# Patient Record
Sex: Male | Born: 1950 | Race: White | Hispanic: No | Marital: Married | Smoking: Former smoker
Health system: Southern US, Community
[De-identification: ages and names within clinical notes are randomized; demographics above are authoritative.]

## PROBLEM LIST (undated history)

## (undated) DIAGNOSIS — F32A Depression, unspecified: Secondary | ICD-10-CM

## (undated) DIAGNOSIS — M1990 Unspecified osteoarthritis, unspecified site: Secondary | ICD-10-CM

## (undated) DIAGNOSIS — I82409 Acute embolism and thrombosis of unspecified deep veins of unspecified lower extremity: Secondary | ICD-10-CM

## (undated) DIAGNOSIS — R972 Elevated prostate specific antigen [PSA]: Secondary | ICD-10-CM

## (undated) DIAGNOSIS — F329 Major depressive disorder, single episode, unspecified: Secondary | ICD-10-CM

## (undated) DIAGNOSIS — K4090 Unilateral inguinal hernia, without obstruction or gangrene, not specified as recurrent: Secondary | ICD-10-CM

## (undated) DIAGNOSIS — G2581 Restless legs syndrome: Secondary | ICD-10-CM

## (undated) HISTORY — DX: Acute embolism and thrombosis of unspecified deep veins of unspecified lower extremity: I82.409

## (undated) HISTORY — PX: WISDOM TOOTH EXTRACTION: SHX21

## (undated) HISTORY — PX: THROMBECTOMY: PRO61

## (undated) HISTORY — PX: TRIGGER FINGER RELEASE: SHX641

## (undated) HISTORY — DX: Elevated prostate specific antigen (PSA): R97.20

## (undated) HISTORY — PX: HERNIA REPAIR: SHX51

---

## 2015-05-08 ENCOUNTER — Encounter: Payer: Self-pay | Admitting: Gastroenterology

## 2015-05-20 ENCOUNTER — Encounter (HOSPITAL_COMMUNITY): Payer: Self-pay | Admitting: Emergency Medicine

## 2015-05-20 ENCOUNTER — Emergency Department (HOSPITAL_COMMUNITY)
Admission: EM | Admit: 2015-05-20 | Discharge: 2015-05-20 | Disposition: A | Discharge status: Home / Self Care | Payer: BLUE CROSS/BLUE SHIELD | Attending: Emergency Medicine | Admitting: Emergency Medicine

## 2015-05-20 DIAGNOSIS — Z8719 Personal history of other diseases of the digestive system: Secondary | ICD-10-CM | POA: Insufficient documentation

## 2015-05-20 DIAGNOSIS — S79922A Unspecified injury of left thigh, initial encounter: Secondary | ICD-10-CM | POA: Diagnosis present

## 2015-05-20 DIAGNOSIS — Y9289 Other specified places as the place of occurrence of the external cause: Secondary | ICD-10-CM | POA: Diagnosis not present

## 2015-05-20 DIAGNOSIS — X58XXXA Exposure to other specified factors, initial encounter: Secondary | ICD-10-CM | POA: Insufficient documentation

## 2015-05-20 DIAGNOSIS — Z8669 Personal history of other diseases of the nervous system and sense organs: Secondary | ICD-10-CM | POA: Insufficient documentation

## 2015-05-20 DIAGNOSIS — S76212A Strain of adductor muscle, fascia and tendon of left thigh, initial encounter: Secondary | ICD-10-CM | POA: Diagnosis not present

## 2015-05-20 DIAGNOSIS — Z8659 Personal history of other mental and behavioral disorders: Secondary | ICD-10-CM | POA: Diagnosis not present

## 2015-05-20 DIAGNOSIS — S39011A Strain of muscle, fascia and tendon of abdomen, initial encounter: Secondary | ICD-10-CM

## 2015-05-20 DIAGNOSIS — Y9389 Activity, other specified: Secondary | ICD-10-CM | POA: Insufficient documentation

## 2015-05-20 DIAGNOSIS — Z8739 Personal history of other diseases of the musculoskeletal system and connective tissue: Secondary | ICD-10-CM | POA: Insufficient documentation

## 2015-05-20 DIAGNOSIS — Y998 Other external cause status: Secondary | ICD-10-CM | POA: Insufficient documentation

## 2015-05-20 HISTORY — DX: Unspecified osteoarthritis, unspecified site: M19.90

## 2015-05-20 HISTORY — DX: Unilateral inguinal hernia, without obstruction or gangrene, not specified as recurrent: K40.90

## 2015-05-20 HISTORY — DX: Restless legs syndrome: G25.81

## 2015-05-20 HISTORY — DX: Major depressive disorder, single episode, unspecified: F32.9

## 2015-05-20 HISTORY — DX: Depression, unspecified: F32.A

## 2015-05-20 NOTE — ED Notes (Addendum)
Pt c/o left groin pain onset last night after squatting down. Pt reports when he went to stand up he felt a tear and heard a pop sound. Pt reports that entire leg is swollen and bruised. Pt had a hernia repair 12 years on same side with mesh.

## 2015-05-25 ENCOUNTER — Ambulatory Visit (HOSPITAL_COMMUNITY)
Admission: RE | Admit: 2015-05-25 | Discharge: 2015-05-25 | Disposition: A | Discharge status: Home / Self Care | Payer: BLUE CROSS/BLUE SHIELD | Source: Ambulatory Visit | Attending: Cardiovascular Disease | Admitting: Cardiovascular Disease

## 2015-05-25 ENCOUNTER — Other Ambulatory Visit (HOSPITAL_COMMUNITY): Payer: Self-pay | Admitting: Internal Medicine

## 2015-05-25 DIAGNOSIS — I82402 Acute embolism and thrombosis of unspecified deep veins of left lower extremity: Secondary | ICD-10-CM | POA: Insufficient documentation

## 2015-05-25 DIAGNOSIS — R52 Pain, unspecified: Secondary | ICD-10-CM

## 2015-05-25 DIAGNOSIS — I82812 Embolism and thrombosis of superficial veins of left lower extremities: Secondary | ICD-10-CM | POA: Diagnosis not present

## 2015-05-25 DIAGNOSIS — M79605 Pain in left leg: Secondary | ICD-10-CM | POA: Diagnosis not present

## 2015-05-25 DIAGNOSIS — M7989 Other specified soft tissue disorders: Secondary | ICD-10-CM | POA: Diagnosis not present

## 2015-05-28 ENCOUNTER — Other Ambulatory Visit: Payer: Self-pay | Admitting: Internal Medicine

## 2015-05-28 DIAGNOSIS — M79605 Pain in left leg: Secondary | ICD-10-CM

## 2015-05-30 ENCOUNTER — Ambulatory Visit
Admission: RE | Admit: 2015-05-30 | Discharge: 2015-05-30 | Disposition: A | Discharge status: Home / Self Care | Payer: BLUE CROSS/BLUE SHIELD | Source: Ambulatory Visit | Attending: Internal Medicine | Admitting: Internal Medicine

## 2015-05-30 DIAGNOSIS — M79605 Pain in left leg: Secondary | ICD-10-CM

## 2015-05-30 NOTE — ED Provider Notes (Signed)
CSN: [id number]     Arrival date & time 05/20/15  1255 History   First MD Initiated Contact with Patient 05/20/15 1420     Chief Complaint  Patient presents with  . Groin Pain  . Leg Pain     (Consider location/radiation/quality/duration/timing/severity/associated sxs/prior Treatment) HPI   65 year old male with left groin pain. Acute onset last night. Patient was standing up from a squatting position. S he was doing this he felt a "pop" in his left inguinal region. He has had persistent soreness since then which is worse with certain movements. He is also noticed diffuse swelling of his left lower extremity. No numbness or tingling. Reports past history of left inguinal hernia repair but no significant issues since that time. No urinary complaints. No rash. No mass.  Past Medical History  Diagnosis Date  . Hernia, inguinal   . Arthritis   . Restless leg syndrome   . Depression    Past Surgical History  Procedure Laterality Date  . Hernia repair    . Trigger finger release    . Wisdom tooth extraction     No family history on file. Social History  Substance Use Topics  . Smoking status: Never Smoker   . Smokeless tobacco: None  . Alcohol Use: Yes     Comment: 1 beer a night    Review of Systems  All systems reviewed and negative, other than as noted in HPI.   Allergies  Review of patient's allergies indicates no known allergies.  Home Medications   Prior to Admission medications   Not on File   BP 144/79 mmHg  Pulse 79  Temp(Src) 98.2 F (36.8 C) (Oral)  Resp 18  Ht 6' 0.5" (1.842 m)  SpO2 97% Physical Exam  Constitutional: He appears well-developed and well-nourished. No distress.  HENT:  Head: Normocephalic and atraumatic.  Eyes: Conjunctivae are normal. Right eye exhibits no discharge. Left eye exhibits no discharge.  Neck: Neck supple.  Cardiovascular: Normal rate, regular rhythm and normal heart sounds.  Exam reveals no gallop and no friction rub.    No murmur heard. Pulmonary/Chest: Effort normal and breath sounds normal. No respiratory distress.  Abdominal: Soft. He exhibits no distension. There is no tenderness.  Musculoskeletal: He exhibits no edema or tenderness.  Diffuse swelling of left lower extremity from inguinal region - distally. Some mild tenderness to palpation along the left inguinal crease. No overlying skin changes. Noble/mass. Patient reports increased pain with leg adduction. Neurovascular intact distally.  Neurological: He is alert.  Skin: Skin is warm and dry.  Psychiatric: He has a normal mood and affect. His behavior is normal. Thought content normal.  Nursing note and vitals reviewed.   ED Course  Procedures (including critical care time) Labs Review Labs Reviewed - No data to display  Imaging Review Ct Femur Left Wo Contrast  05/30/2015  CLINICAL DATA:  Left groin and thigh pain. History of blood clot (DVT?) on 05/25/2015. Pain for 10 days. Assessment for fracture. Prior hernia repair with mesh. EXAM: CT OF THE LEFT FEMUR WITHOUT CONTRAST TECHNIQUE: Multidetector CT imaging was performed according to the standard protocol. Multiplanar CT image reconstructions were also generated. COMPARISON:  None. FINDINGS: Low-level presacral edema in the pelvis. There stranding around the external iliac artery and vein, common iliac artery and vein, and superficial femoral artery and vein, with the vein demonstrating higher luminal density than the artery throughout this entire course. This is most confluent overlying the external iliac vessels as on  image 1 series 5. Some of the stranding along the left groin may be from the reported prior hernia repair. I do not see a significant recurrent hernia. An injury along the hernia repair could also cause stranding along the inguinal region shown on track today's exam but is considered unlikely. Subcutaneous edema noted in the thigh and lateral to the left hip. Trace knee effusion. There  is degenerative spurring of the acetabulum and femoral head. Synovial herniation pit at the junction of the left femoral head and neck. Chronically fragmented acetabular spur anteriorly. No femur fracture identified. No CT findings of iliopsoas avulsion. No compelling findings of hamstring avulsion. No hip effusion. IMPRESSION: 1. Abnormal difference in density between the left external iliac, common femoral, and superficial femoral and popliteal artery and vein, possible causes diffuse left lower extremity DVT versus less likely occlusion of the left femoral artery. Normally on a noncontrast exam these 2 vessels would be similar in density. There is abnormal stranding around the vascular structures which would seem to favor DVT. Correlate with any recent sonography. 2. Hematoma or confluent edema superficial to the external iliac vessels on image 1 series 5. There is some stranding along the left inguinal ring which could be postoperative or less likely due to an injury at prior operative site in this vicinity. However this stranding is not really out of proportion to stranding elsewhere in the lower pelvis. 3. Subcutaneous edema in the thigh and lateral to the left hip, nonspecific. 4. No fracture or acute bony findings. Degenerative arthropathy of the left hip. 5. Trace knee effusion. Electronically Signed   By: Gaylyn Rong M.D.   On: 05/30/2015 08:55   I have personally reviewed and evaluated these images and lab results as part of my medical decision-making.   EKG Interpretation None      MDM   Final diagnoses:  Strain of groin, initial encounter    65 year old male with symptoms and mechanism consistent with left groin strain. Family is concerned for possible DVT. Clinically I doubt this although patient does have diffuse swelling of his left lower extremity which is not necessarily consistent with a simple left groin strain. He has no calf tenderness. His neurovascular intact. Is a past  history of left internal hernia repair. Cannot appreciate any 7 bulge in this area. There is no bruising or other concerning skin changes. At this point plan symptomatic treatment. Return precautions were discussed. Advise follow-up if symptoms not begin to improve the next few days. Emergent return precautions sooner were discussed.    Raeford Razor, MD 05/30/15 1500

## 2015-05-31 ENCOUNTER — Encounter: Payer: Self-pay | Admitting: Vascular Surgery

## 2015-05-31 ENCOUNTER — Inpatient Hospital Stay (HOSPITAL_COMMUNITY)
Admission: AD | Admit: 2015-05-31 | Discharge: 2015-06-04 | DRG: 272 | Disposition: A | Discharge status: Home / Self Care | Payer: BLUE CROSS/BLUE SHIELD | Source: Ambulatory Visit | Attending: Vascular Surgery | Admitting: Vascular Surgery

## 2015-05-31 ENCOUNTER — Ambulatory Visit (INDEPENDENT_AMBULATORY_CARE_PROVIDER_SITE_OTHER): Payer: BLUE CROSS/BLUE SHIELD | Admitting: Vascular Surgery

## 2015-05-31 ENCOUNTER — Other Ambulatory Visit: Payer: Self-pay | Admitting: Physician Assistant

## 2015-05-31 VITALS — BP 153/86 | HR 87 | Temp 99.2°F | Ht 72.5 in | Wt 200.0 lb

## 2015-05-31 DIAGNOSIS — Z79899 Other long term (current) drug therapy: Secondary | ICD-10-CM

## 2015-05-31 DIAGNOSIS — I82402 Acute embolism and thrombosis of unspecified deep veins of left lower extremity: Secondary | ICD-10-CM

## 2015-05-31 DIAGNOSIS — F329 Major depressive disorder, single episode, unspecified: Secondary | ICD-10-CM | POA: Diagnosis present

## 2015-05-31 DIAGNOSIS — I8290 Acute embolism and thrombosis of unspecified vein: Secondary | ICD-10-CM

## 2015-05-31 DIAGNOSIS — I82412 Acute embolism and thrombosis of left femoral vein: Secondary | ICD-10-CM | POA: Diagnosis present

## 2015-05-31 DIAGNOSIS — G2581 Restless legs syndrome: Secondary | ICD-10-CM | POA: Diagnosis present

## 2015-05-31 DIAGNOSIS — Z7901 Long term (current) use of anticoagulants: Secondary | ICD-10-CM

## 2015-05-31 DIAGNOSIS — I82422 Acute embolism and thrombosis of left iliac vein: Secondary | ICD-10-CM | POA: Diagnosis present

## 2015-05-31 DIAGNOSIS — I82432 Acute embolism and thrombosis of left popliteal vein: Secondary | ICD-10-CM | POA: Diagnosis present

## 2015-05-31 DIAGNOSIS — Z8249 Family history of ischemic heart disease and other diseases of the circulatory system: Secondary | ICD-10-CM

## 2015-05-31 DIAGNOSIS — I82409 Acute embolism and thrombosis of unspecified deep veins of unspecified lower extremity: Secondary | ICD-10-CM | POA: Insufficient documentation

## 2015-05-31 LAB — COMPREHENSIVE METABOLIC PANEL
ALT: 113 U/L — AB (ref 17–63)
AST: 103 U/L — AB (ref 15–41)
Albumin: 2.5 g/dL — ABNORMAL LOW (ref 3.5–5.0)
Alkaline Phosphatase: 245 U/L — ABNORMAL HIGH (ref 38–126)
Anion gap: 11 (ref 5–15)
BUN: 15 mg/dL (ref 6–20)
CHLORIDE: 98 mmol/L — AB (ref 101–111)
CO2: 28 mmol/L (ref 22–32)
CREATININE: 0.86 mg/dL (ref 0.61–1.24)
Calcium: 9 mg/dL (ref 8.9–10.3)
GFR calc Af Amer: >60 mL/min (ref >60)
Glucose, Bld: 116 mg/dL — ABNORMAL HIGH (ref 65–99)
Potassium: 3.9 mmol/L (ref 3.5–5.1)
Sodium: 137 mmol/L (ref 135–145)
Total Bilirubin: 0.1 mg/dL — ABNORMAL LOW (ref 0.3–1.2)
Total Protein: 6.9 g/dL (ref 6.5–8.1)

## 2015-05-31 LAB — APTT: APTT: 38 s — AB (ref 24–37)

## 2015-05-31 LAB — CBC
HEMATOCRIT: 32.9 % — AB (ref 39.0–52.0)
Hemoglobin: 11 g/dL — ABNORMAL LOW (ref 13.0–17.0)
MCH: 28.2 pg (ref 26.0–34.0)
MCHC: 33.4 g/dL (ref 30.0–36.0)
MCV: 84.4 fL (ref 78.0–100.0)
PLATELETS: 372 10*3/uL (ref 150–400)
RBC: 3.9 MIL/uL — AB (ref 4.22–5.81)
RDW: 13 % (ref 11.5–15.5)
WBC: 9.2 10*3/uL (ref 4.0–10.5)

## 2015-05-31 LAB — URINALYSIS, ROUTINE W REFLEX MICROSCOPIC
Bilirubin Urine: NEGATIVE
GLUCOSE, UA: NEGATIVE mg/dL
Hgb urine dipstick: NEGATIVE
KETONES UR: NEGATIVE mg/dL
LEUKOCYTES UA: NEGATIVE
Nitrite: NEGATIVE
PH: 6 (ref 5.0–8.0)
Protein, ur: NEGATIVE mg/dL
SPECIFIC GRAVITY, URINE: 1.011 (ref 1.005–1.030)

## 2015-05-31 LAB — PROTIME-INR
INR: 1.28 (ref 0.00–1.49)
PROTHROMBIN TIME: 16.1 s — AB (ref 11.6–15.2)

## 2015-05-31 LAB — HEPARIN LEVEL (UNFRACTIONATED): HEPARIN UNFRACTIONATED: 0.47 [IU]/mL (ref 0.30–0.70)

## 2015-05-31 MED ORDER — MORPHINE SULFATE (PF) 2 MG/ML IV SOLN
2.0000 mg | INTRAVENOUS | Status: DC | PRN
  Administered 2015-06-01 (×4): 2 mg via INTRAVENOUS
  Administered 2015-06-01: 1 mg via INTRAVENOUS
  Administered 2015-06-01: 2 mg via INTRAVENOUS
  Administered 2015-06-01 (×2): 3 mg via INTRAVENOUS
  Administered 2015-06-01: 2 mg via INTRAVENOUS
  Administered 2015-06-02: 3 mg via INTRAVENOUS
  Administered 2015-06-02: 2 mg via INTRAVENOUS
  Administered 2015-06-02 – 2015-06-03 (×3): 3 mg via INTRAVENOUS
  Filled 2015-05-31 (×2): qty 2
  Filled 2015-05-31 (×2): qty 1
  Filled 2015-05-31: qty 2
  Filled 2015-05-31 (×3): qty 1
  Filled 2015-05-31: qty 2
  Filled 2015-05-31: qty 1
  Filled 2015-05-31 (×2): qty 2
  Filled 2015-05-31: qty 1

## 2015-05-31 MED ORDER — LABETALOL HCL 5 MG/ML IV SOLN: 10.0000 mg | INTRAVENOUS | Status: DC | PRN

## 2015-05-31 MED ORDER — GUAIFENESIN-DM 100-10 MG/5ML PO SYRP
15.0000 mL | ORAL_SOLUTION | ORAL | Status: DC | PRN
  Filled 2015-05-31: qty 15

## 2015-05-31 MED ORDER — BISACODYL 10 MG RE SUPP: 10.0000 mg | Freq: Every day | RECTAL | Status: DC | PRN

## 2015-05-31 MED ORDER — ROPINIROLE HCL 1 MG PO TABS
1.0000 mg | ORAL_TABLET | Freq: Every day | ORAL | Status: DC
  Filled 2015-05-31 (×4): qty 1

## 2015-05-31 MED ORDER — HEPARIN (PORCINE) IN NACL 100-0.45 UNIT/ML-% IJ SOLN
1500.0000 [IU]/h | INTRAMUSCULAR | Status: DC
  Administered 2015-06-01: 1500 [IU]/h via INTRAVENOUS
  Filled 2015-05-31 (×2): qty 250

## 2015-05-31 MED ORDER — OXYCODONE HCL 5 MG PO TABS
5.0000 mg | ORAL_TABLET | ORAL | Status: DC | PRN
  Administered 2015-05-31 (×2): 10 mg via ORAL
  Administered 2015-06-01 (×2): 5 mg via ORAL
  Administered 2015-06-01: 10 mg via ORAL
  Administered 2015-06-01 – 2015-06-03 (×6): 5 mg via ORAL
  Filled 2015-05-31 (×2): qty 1
  Filled 2015-05-31 (×2): qty 2
  Filled 2015-05-31 (×5): qty 1
  Filled 2015-05-31: qty 2

## 2015-05-31 MED ORDER — ACETAMINOPHEN 325 MG PO TABS
325.0000 mg | ORAL_TABLET | ORAL | Status: DC | PRN
  Administered 2015-05-31 – 2015-06-01 (×2): 650 mg via ORAL
  Filled 2015-05-31 (×2): qty 1
  Filled 2015-05-31: qty 2

## 2015-05-31 MED ORDER — ACETAMINOPHEN 325 MG RE SUPP
325.0000 mg | RECTAL | Status: DC | PRN
  Filled 2015-05-31: qty 2

## 2015-05-31 MED ORDER — ONDANSETRON HCL 4 MG/2ML IJ SOLN: 4.0000 mg | Freq: Four times a day (QID) | INTRAMUSCULAR | Status: DC | PRN

## 2015-05-31 MED ORDER — PANTOPRAZOLE SODIUM 40 MG PO TBEC
40.0000 mg | DELAYED_RELEASE_TABLET | Freq: Every day | ORAL | Status: DC
  Administered 2015-05-31 – 2015-06-03 (×3): 40 mg via ORAL
  Filled 2015-05-31 (×3): qty 1

## 2015-05-31 MED ORDER — PHENOL 1.4 % MT LIQD: 1.0000 | OROMUCOSAL | Status: DC | PRN

## 2015-05-31 MED ORDER — PAROXETINE HCL 20 MG PO TABS
20.0000 mg | ORAL_TABLET | Freq: Every day | ORAL | Status: DC
  Filled 2015-05-31: qty 1

## 2015-05-31 MED ORDER — MAGNESIUM HYDROXIDE 400 MG/5ML PO SUSP: 30.0000 mL | Freq: Every day | ORAL | Status: DC | PRN

## 2015-05-31 MED ORDER — HEPARIN BOLUS VIA INFUSION
5000.0000 [IU] | Freq: Once | INTRAVENOUS | Status: DC
  Filled 2015-05-31: qty 5000

## 2015-05-31 MED ORDER — ALUM & MAG HYDROXIDE-SIMETH 200-200-20 MG/5ML PO SUSP
15.0000 mL | ORAL | Status: DC | PRN
  Filled 2015-05-31: qty 30

## 2015-05-31 MED ORDER — ROPINIROLE HCL 1 MG PO TABS
0.5000 mg | ORAL_TABLET | Freq: Every day | ORAL | Status: DC
  Filled 2015-05-31: qty 1

## 2015-05-31 MED ORDER — METOPROLOL TARTRATE 1 MG/ML IV SOLN: 2.0000 mg | INTRAVENOUS | Status: DC | PRN

## 2015-05-31 MED ORDER — HYDRALAZINE HCL 20 MG/ML IJ SOLN
5.0000 mg | INTRAMUSCULAR | Status: DC | PRN
Start: 2015-05-31 — End: 2015-06-03

## 2015-05-31 NOTE — Progress Notes (Signed)
HISTORY AND PHYSICAL     CC:  Acute DVT Referring Provider:  Alysia Penna, MD  HPI: This is a 65 y.o. Rice who states that last Saturday (11 days ago), he was in his garage doing some work bending down.  When he stood up, he felt a pop in his left groin.  He states that he had swelling in the left thigh.  He went to bed with his leg elevated and when he woke, his entire leg was swollen down to the ankle.  He went to the ER that day and was told he had a groin strain.  His wife states they did not do any tests and told them they did not think it was a DVT.  He continued to have swelling and had a venous ultrasound on 05/25/15, which revealed acute DVT throughout the left leg with superficial thrombus in the left proximal greater saphenous vein.   His pain is not well controlled on the hydrocodone, which he was taking every 8 hours and has been increased to every 6 hours.  The pt was placed on Xarelto last Friday, 05/25/15.  Dr. Link Snuffer referred the pt to Dr. Darrick Penna for further evaluation.  The pt states he last traveled to the [place] in August 2016.  He does have family hx of DVT with his brother who is on Warfarin for this.    He has a hx of left hernia repair with mesh ~ 12 years ago and trigger finger release.  He has hx of arthritis, restless leg syndrome and some mild depression.  He does not have any hx of bleeding (GIB or brain).  Past Medical History  Diagnosis Date  . Hernia, inguinal   . Arthritis   . Restless leg syndrome   . Depression     Past Surgical History  Procedure Laterality Date  . Hernia repair    . Trigger finger release    . Wisdom tooth extraction      No Known Allergies  No current outpatient prescriptions on file.   No current facility-administered medications for this visit.   Family History  Problem Relation Age of Onset  . Heart disease Mother   . Arthritis Mother   . Hypertension Father   . Stroke Father       Social History   Social  History  . Marital Status: Married    Spouse Name: N/A  . Number of Children: N/A  . Years of Education: N/A   Occupational History  . Not on file.   Social History Main Topics  . Smoking status: Never Smoker   . Smokeless tobacco: Not on file  . Alcohol Use: Yes     Comment: 1 beer a night  . Drug Use: No  . Sexual Activity: Not on file   Other Topics Concern  . Not on file   Social History Narrative  . No narrative on file     REVIEW OF SYSTEMS:    denotes positive finding,  denotes negative finding Cardiac  Comments:  Chest pain or chest pressure:    Shortness of breath upon exertion:    Short of breath when lying flat:    Irregular heart rhythm:        Vascular    Pain in calf, thigh, or hip brought on by ambulation:    Pain in left leg  x Developed sudden onset of left leg pain 05/19/15  Blood clot in your veins:    Leg swelling:  x Left leg      Pulmonary    Oxygen at home:    Productive cough:     Wheezing:         Neurologic    Sudden weakness in arms or legs:     Sudden numbness in arms or legs:     Sudden onset of difficulty speaking or slurred speech:    Temporary loss of vision in one eye:     Problems with dizziness:         Gastrointestinal    Blood in stool:     Vomited blood:         Genitourinary    Burning when urinating:     Blood in urine:        Psychiatric    depression:  x Taking Paxil      Hematologic    Bleeding problems:    Problems with blood clotting too easily:        Skin    Rashes or ulcers:        Constitutional    Fever or chills:      PHYSICAL EXAMINATION:  Filed Vitals:   05/31/15 1516  BP: 153/86  Pulse: 87  Temp: 99.2 F (37.3 C)    General:  WDWN in NAD; vital signs documented above Gait: Not observed HENT: WNL, normocephalic Pulmonary: normal non-labored breathing , without Rales, rhonchi,  wheezing Cardiac: regular HR, without  Murmurs, rubs or gallops; without carotid bruits Abdomen:  soft, NT, no masses Skin: without rashes Vascular Exam/Pulses:  Right Left  Radial 2+ (normal) 2+ (normal)  DP 2+ (normal) 2+ (normal)  PT 2+ (normal) 2+ (normal)   Extremities: LLE has significant swelling from thigh to left foot and is painful to palpation.  There are no ulcers or non healing wounds. Neurologic: A&O X 3;  No focal weakness or paresthesias are detected Psychiatric:  The pt has Normal affect.   Non-Invasive Vascular Imaging:   [facility] venous duplex 05/25/15: Acute DVT throughout LLE  No evidence of RLE DVT  Pt meds includes: Statin:  No. Beta Blocker:  No. Aspirin:  No. ACEI:  No. ARB:  No. Other Antiplatelet/Anticoagulant:  Yes.   Xarelto started 05/25/15   ASSESSMENT/PLAN:: 65 y.o. Rice recent diagnosis if left leg DVT   -pt does have extensive acute DVT in the left leg.  He was placed on Xarelto last Friday and has been referred to Dr. Darrick Penna for further evaluation. -Dr. Darrick Penna has given the option of thrombolysis to the pt, which he and his wife have opted for.  Dr. Darrick Penna discussed there is a risk of bleeding (GIB and brain) with the procedure.  They understand and wish to proceed. -Dr. Darrick Penna has spoken to IR - we will admit the pt tonight to [facility], discontinue Xarelto and start IV heparin and he will be set up for thrombolysis tomorrow morning.     Doreatha Massed, PA-C [facility] [phone]  Clinic MD:  Pt seen and examined in conjunction with Dr. Darrick Penna    History and exam findings as above. The patient really has no contraindication to thrombolysis that I can elicit from his history. I believe he would be best served for thrombolysis for decreasing his clot burden and also reduce risk of postphlebitic syndrome down the road. I discussed with the patient the procedure and the possible risks associated with this. He wishes to proceed with thrombolysis at this point. I spoke  with Dr. Fredia Sorrow from interventional  radiology who will get the patient on the schedule tomorrow morning. We will admit him to the hospital this evening for heparin therapy and stop his Xarelto.  Fabienne Bruns, MD [facility] Office: [phone] Pager: [phone]

## 2015-05-31 NOTE — Progress Notes (Addendum)
ANTICOAGULATION CONSULT NOTE - Initial Consult  Pharmacy Consult for Heparin Indication: DVT  No Known Allergies  Patient Measurements:  Weight -90.7 kg  Height 184.2 cm IBW -78.7 kg Heparin Dosing Weight: using actual weight  Vital Signs: Temp: 99.2 F (37.3 C) (01/26 1516) Temp Source: Oral (01/26 1516) BP: 153/86 mmHg (01/26 1516) Pulse Rate: 87 (01/26 1516)  Labs: No results for input(s): HGB, HCT, PLT, APTT, LABPROT, INR, HEPARINUNFRC, HEPRLOWMOCWT, CREATININE, CKTOTAL, CKMB, TROPONINI in the last 72 hours.  CrCl cannot be calculated (Patient has no serum creatinine result on file.).   Medical History: Past Medical History  Diagnosis Date  . Hernia, inguinal   . Arthritis   . Restless leg syndrome   . Depression    Assessment: 65 year old male with LLE DVT diagnosed on 05/25/15. Xarelto was initiated that day and referred to Dr. Darrick Penna. Plan for thrombolysis of clot on 06/01/15 AM and to transition to from Xarelto to IV heparin for this procedure. Baseline labs are pending. Last dose of Xarelto was 05/31/15 at 0600 AM per patient report.    Goal of Therapy:  Heparin level 0.3-0.7 units/ml aPTT 66-102 seconds Monitor platelets by anticoagulation protocol: Yes   Plan:  Baseline APTT and heparin level Initiate heparin at 0600 AM on 05/31/14 - 24 hours since last Xarelto dose. Dose: 1500 units/hr.  Heparin level 6 hours after start  F/up plan for thrombolysis Daily heparin level and CBC while on therapy. Monitor for signs and symptoms of bleeding.   Link Snuffer, PharmD, BCPS Clinical Pharmacist [phone]  05/31/2015,6:01 PM

## 2015-05-31 NOTE — Progress Notes (Signed)
Patient lying in bed, pain meds given at 1859. Wife present at bedside, no needs expressed at this time. Call light within reach.

## 2015-06-01 ENCOUNTER — Inpatient Hospital Stay (HOSPITAL_COMMUNITY): Payer: BLUE CROSS/BLUE SHIELD

## 2015-06-01 LAB — GLUCOSE, CAPILLARY
GLUCOSE-CAPILLARY: 115 mg/dL — AB (ref 65–99)
GLUCOSE-CAPILLARY: 47 mg/dL — AB (ref 65–99)
Glucose-Capillary: 110 mg/dL — ABNORMAL HIGH (ref 65–99)

## 2015-06-01 LAB — HEPARIN LEVEL (UNFRACTIONATED): Heparin Unfractionated: 0.35 IU/mL (ref 0.30–0.70)

## 2015-06-01 LAB — APTT
APTT: 34 s (ref 24–37)
APTT: 136 s — AB (ref 24–37)

## 2015-06-01 LAB — FIBRINOGEN: Fibrinogen: 364 mg/dL (ref 204–475)

## 2015-06-01 LAB — MRSA PCR SCREENING: MRSA by PCR: NEGATIVE

## 2015-06-01 MED ORDER — LIDOCAINE HCL 1 % IJ SOLN
INTRAMUSCULAR | Status: AC
  Filled 2015-06-01: qty 20

## 2015-06-01 MED ORDER — HEPARIN (PORCINE) IN NACL 100-0.45 UNIT/ML-% IJ SOLN
1000.0000 [IU]/h | INTRAMUSCULAR | Status: DC
  Administered 2015-06-01: 1000 [IU]/h via INTRAVENOUS
  Filled 2015-06-01: qty 250

## 2015-06-01 MED ORDER — MIDAZOLAM HCL 2 MG/2ML IJ SOLN
INTRAMUSCULAR | Status: AC
  Filled 2015-06-01: qty 4

## 2015-06-01 MED ORDER — TENECTEPLASE 50 MG IV KIT
0.5000 mg/h | PACK | INTRAVENOUS | Status: DC
  Administered 2015-06-01 (×2): 0.5 mg/h
  Filled 2015-06-01 (×6): qty 0.5

## 2015-06-01 MED ORDER — SODIUM CHLORIDE 0.9 % IV SOLN
1.0000 mg/h | INTRAVENOUS | Status: DC
  Administered 2015-06-01 (×4): 1 mg/h
  Filled 2015-06-01 (×6): qty 0.5

## 2015-06-01 MED ORDER — MIDAZOLAM HCL 2 MG/2ML IJ SOLN
INTRAMUSCULAR | Status: AC | PRN
  Administered 2015-06-01: 1 mg via INTRAVENOUS
  Administered 2015-06-01 (×2): 0.5 mg via INTRAVENOUS

## 2015-06-01 MED ORDER — SODIUM CHLORIDE 0.9 % IV SOLN: 250.0000 mL | INTRAVENOUS | Status: DC | PRN

## 2015-06-01 MED ORDER — IOHEXOL 300 MG/ML  SOLN
100.0000 mL | Freq: Once | INTRAMUSCULAR | Status: AC | PRN
  Administered 2015-06-01: 30 mL via INTRAVENOUS

## 2015-06-01 MED ORDER — MORPHINE SULFATE (PF) 4 MG/ML IV SOLN
INTRAVENOUS | Status: AC
  Filled 2015-06-01: qty 1

## 2015-06-01 MED ORDER — OXYCODONE HCL 5 MG PO TABS
ORAL_TABLET | ORAL | Status: AC
  Filled 2015-06-01: qty 1

## 2015-06-01 MED ORDER — SODIUM CHLORIDE 0.9% FLUSH: 3.0000 mL | INTRAVENOUS | Status: DC | PRN

## 2015-06-01 MED ORDER — FENTANYL CITRATE (PF) 100 MCG/2ML IJ SOLN
INTRAMUSCULAR | Status: AC
  Filled 2015-06-01: qty 4

## 2015-06-01 MED ORDER — FENTANYL CITRATE (PF) 100 MCG/2ML IJ SOLN
INTRAMUSCULAR | Status: AC | PRN
  Administered 2015-06-01: 25 ug via INTRAVENOUS
  Administered 2015-06-01: 50 ug via INTRAVENOUS

## 2015-06-01 MED ORDER — SODIUM CHLORIDE 0.9% FLUSH
3.0000 mL | Freq: Two times a day (BID) | INTRAVENOUS | Status: DC
  Administered 2015-06-01 (×2): 3 mL via INTRAVENOUS
  Administered 2015-06-02: 10 mL via INTRAVENOUS
  Administered 2015-06-03: 3 mL via INTRAVENOUS

## 2015-06-01 NOTE — Progress Notes (Signed)
Pt states pain is better since moving onto his back and with medications

## 2015-06-01 NOTE — Progress Notes (Signed)
ANTICOAGULATION CONSULT NOTE  Pharmacy Consult for Heparin Indication: DVT  No Known Allergies  Patient Measurements: Weight: 184 lb 4.9 oz (83.6 kg)Weight -90.7 kg  Height 184.2 cm IBW -78.7 kg Heparin Dosing Weight: using actual weight  Vital Signs: Temp: 99.3 F (37.4 C) (01/27 0539) Temp Source: Oral (01/27 0539) BP: 124/68 mmHg (01/27 0539) Pulse Rate: 83 (01/27 0539)  Labs:  Recent Labs  05/31/15 1935  HGB 11.0*  HCT 32.9*  PLT 372  APTT 38*  LABPROT 16.1*  INR 1.28  HEPARINUNFRC 0.47  CREATININE 0.86    Estimated Creatinine Clearance: 98 mL/min (by C-G formula based on Cr of 0.86).   Medical History: Past Medical History  Diagnosis Date  . Hernia, inguinal   . Arthritis   . Restless leg syndrome   . Depression    Assessment: 65 year old male with LLE DVT diagnosed on 05/25/15. Xarelto was initiated that day and referred to Dr. Darrick Penna. Plan for thrombolysis of clot on 06/01/15 AM and to transition to from Xarelto to IV heparin for this procedure. Last dose of Xarelto was 05/31/15 at 0600 AM per patient report. Baseline aPTT 38, HL 0.47 - follow aPTTs until correlating. Hg low 11, plt wnl. No bleed documented.  Goal of Therapy:  Heparin level 0.3-0.7 units/ml aPTT 66-102 seconds Monitor platelets by anticoagulation protocol: Yes   Plan:  Heparin at 1500 units/h  6h aPTT, Daily heparin level and CBC  Monitor for signs and symptoms of bleeding  Thrombolysis 1/27  Babs Bertin, PharmD, [facility] Clinical Pharmacist Pager [phone] 06/01/2015 10:09 AM

## 2015-06-01 NOTE — Progress Notes (Signed)
Heparin infusion stopped at 2145 after speaking with Link Snuffer, RPH, due to APTT 136.  Will restart heparin infusion at 1000units/hr timed at 2315 per order.

## 2015-06-01 NOTE — Progress Notes (Signed)
Pt states he is having back pain due to being prone for procedure.

## 2015-06-01 NOTE — Sedation Documentation (Signed)
Patient is resting comfortably. Vitals stable. 

## 2015-06-01 NOTE — Sedation Documentation (Signed)
Vital signs stable. Pt complains of L Leg pain, sharp, stabbing. Sedation given

## 2015-06-01 NOTE — Progress Notes (Signed)
Pt vitals stable. No complaints of pain at this time. Pt waiting in IR holding bay for placement to ICU bed.

## 2015-06-01 NOTE — Consult Note (Signed)
Chief Complaint: Patient was seen in consultation today for LLE DVT thrombolysis at the request of Dr Darrick Penna  Referring Physician(s): Dr Fabienne Bruns  History of Present Illness: Ellijah Rice is a 65 y.o. male   Pt noted "pop" in left groin while working at home 05/19/2015 Groin strain diagnosed in ED Continued swelling and pain Seen by Dr Darrick Penna and US performed 05/30/15:  Technically challenging study. No evidence of right lower extremity deep or superficial venous thrombus or incompetence. Acute DVT throughout the left lower extremity with superficial thrombus in the left proximal greater saphenous vein.  Request now for LLE DVT thrombolysis with poss angioplasty/stent placement Dr Darrick Penna discussed with Dr Fredia Sorrow and procedure approved   Past Medical History  Diagnosis Date  . Hernia, inguinal   . Arthritis   . Restless leg syndrome   . Depression     Past Surgical History  Procedure Laterality Date  . Hernia repair    . Trigger finger release    . Wisdom tooth extraction      Allergies: Review of patient's allergies indicates no known allergies.  Medications: Prior to Admission medications   Medication Sig Start Date End Date Taking? Authorizing Provider  Hydrocodone-Acetaminophen 7.5-300 MG TABS Take 1 tablet by mouth every 6 (six) hours.  05/25/15  Yes Historical Provider, MD  XARELTO 20 MG TABS tablet Take 15 mg by mouth 2 (two) times daily.  05/28/15  Yes Historical Provider, MD  PARoxetine (PAXIL) 20 MG tablet Take 10 mg by mouth daily. Reported on 05/31/2015 03/22/15   Historical Provider, MD  rOPINIRole (REQUIP) 0.5 MG tablet Take 0.5 mg by mouth at bedtime. Reported on 05/31/2015 05/04/15   Historical Provider, MD     Family History  Problem Relation Age of Onset  . Heart disease Mother   . Arthritis Mother   . Hypertension Father   . Stroke Father     Social History   Social History  . Marital Status: Married    Spouse Name: N/A  . Number  of Children: N/A  . Years of Education: N/A   Social History Main Topics  . Smoking status: Never Smoker   . Smokeless tobacco: Not on file  . Alcohol Use: Yes     Comment: 1 beer a night  . Drug Use: No  . Sexual Activity: Not on file   Other Topics Concern  . Not on file   Social History Narrative     Review of Systems: A 12 point ROS discussed and pertinent positives are indicated in the HPI above.  All other systems are negative.  Review of Systems  Constitutional: Positive for activity change. Negative for fever, appetite change and fatigue.  Respiratory: Negative for cough and shortness of breath.   Cardiovascular: Negative for chest pain.  Gastrointestinal: Negative for abdominal pain.  Musculoskeletal: Negative for back pain.  Neurological: Negative for weakness.  Psychiatric/Behavioral: Negative for behavioral problems and confusion.    Vital Signs: BP 124/68 mmHg  Pulse 83  Temp(Src) 99.3 F (37.4 C) (Oral)  Resp 16  Wt 184 lb 4.9 oz (83.6 kg)  SpO2 96%  Physical Exam  Constitutional: He is oriented to person, place, and time. He appears well-nourished.  Cardiovascular: Normal rate, regular rhythm and normal heart sounds.   Pulmonary/Chest: Effort normal and breath sounds normal. He has no wheezes.  Abdominal: Soft. Bowel sounds are normal.  Musculoskeletal: Normal range of motion.  LLE +swelling Tender to palpate leg +edema No redness  Good sensation 1+ pulse Left foot  Rt leg wnl  Neurological: He is alert and oriented to person, place, and time.  Skin: Skin is warm and dry.  Psychiatric: He has a normal mood and affect. His behavior is normal. Judgment and thought content normal.  Nursing note and vitals reviewed.   Mallampati Score:  MD Evaluation Airway: WNL Heart: WNL Abdomen: WNL Chest/ Lungs: WNL ASA  Classification: 2 Mallampati/Airway Score: One  Imaging: Ct Femur Left Wo Contrast  05/30/2015  CLINICAL DATA:  Left groin and  thigh pain. History of blood clot (DVT?) on 05/25/2015. Pain for 10 days. Assessment for fracture. Prior hernia repair with mesh. EXAM: CT OF THE LEFT FEMUR WITHOUT CONTRAST TECHNIQUE: Multidetector CT imaging was performed according to the standard protocol. Multiplanar CT image reconstructions were also generated. COMPARISON:  None. FINDINGS: Low-level presacral edema in the pelvis. There stranding around the external iliac artery and vein, common iliac artery and vein, and superficial femoral artery and vein, with the vein demonstrating higher luminal density than the artery throughout this entire course. This is most confluent overlying the external iliac vessels as on image 1 series 5. Some of the stranding along the left groin may be from the reported prior hernia repair. I do not see a significant recurrent hernia. An injury along the hernia repair could also cause stranding along the inguinal region shown on track today's exam but is considered unlikely. Subcutaneous edema noted in the thigh and lateral to the left hip. Trace knee effusion. There is degenerative spurring of the acetabulum and femoral head. Synovial herniation pit at the junction of the left femoral head and neck. Chronically fragmented acetabular spur anteriorly. No femur fracture identified. No CT findings of iliopsoas avulsion. No compelling findings of hamstring avulsion. No hip effusion. IMPRESSION: 1. Abnormal difference in density between the left external iliac, common femoral, and superficial femoral and popliteal artery and vein, possible causes diffuse left lower extremity DVT versus less likely occlusion of the left femoral artery. Normally on a noncontrast exam these 2 vessels would be similar in density. There is abnormal stranding around the vascular structures which would seem to favor DVT. Correlate with any recent sonography. 2. Hematoma or confluent edema superficial to the external iliac vessels on image 1 series 5. There  is some stranding along the left inguinal ring which could be postoperative or less likely due to an injury at prior operative site in this vicinity. However this stranding is not really out of proportion to stranding elsewhere in the lower pelvis. 3. Subcutaneous edema in the thigh and lateral to the left hip, nonspecific. 4. No fracture or acute bony findings. Degenerative arthropathy of the left hip. 5. Trace knee effusion. Electronically Signed   By: Gaylyn Rong M.D.   On: 05/30/2015 08:55    Labs:  CBC:  Recent Labs  05/31/15 1935  WBC 9.2  HGB 11.0*  HCT 32.9*  PLT 372    COAGS:  Recent Labs  05/31/15 1935  INR 1.28  APTT 38*    BMP:  Recent Labs  05/31/15 1935  NA 137  K 3.9  CL 98*  CO2 28  GLUCOSE 116*  BUN 15  CALCIUM 9.0  CREATININE 0.86  GFRNONAA >60  GFRAA >60    LIVER FUNCTION TESTS:  Recent Labs  05/31/15 1935  BILITOT 0.1*  AST 103*  ALT 113*  ALKPHOS 245*  PROT 6.9  ALBUMIN 2.5*    TUMOR MARKERS: No results for input(s):  AFPTM, CEA, CA199, CHROMGRNA in the last 8760 hours.  Assessment and Plan:  LLE DVT + swelling and pain Scheduled now for LLE DVT thrombolysis with possible angioplasty/stent placement Risks and Benefits discussed with the patient including, but not limited to bleeding, possible life threatening bleeding and need for blood product transfusion, vascular injury, stroke, contrast induced renal failure, limb loss and infection. All of the patient's questions were answered, patient is agreeable to proceed. Consent signed and in chart.   Thank you for this interesting consult.  I greatly enjoyed meeting Abhinav Mayorquin and look forward to participating in their care.  A copy of this report was sent to the requesting provider on this date.  Electronically Signed: Dietrich Ke A 06/01/2015, 7:32 AM   I spent a total of 40 Minutes    in face to face in clinical consultation, greater than 50% of which was  counseling/coordinating care for LLE DVT thrombolysis

## 2015-06-01 NOTE — Progress Notes (Signed)
Pt states pain is better. Pt states there is no pain in leg, reports pressure from leg laying on pillow.

## 2015-06-01 NOTE — Progress Notes (Signed)
ANTICOAGULATION CONSULT NOTE  Pharmacy Consult for Heparin Indication: DVT  No Known Allergies  Patient Measurements: Weight: 184 lb 4.9 oz (83.6 kg)Weight -90.7 kg  Height 184.2 cm IBW -78.7 kg Heparin Dosing Weight: using actual weight  Vital Signs: Temp: 102.7 F (39.3 C) (01/27 2008) Temp Source: Oral (01/27 2008) BP: 120/76 mmHg (01/27 2000) Pulse Rate: 102 (01/27 2000)  Labs:  Recent Labs  05/31/15 1935 06/01/15 1516 06/01/15 1818 06/01/15 2045  HGB 11.0*  --   --   --   HCT 32.9*  --   --   --   PLT 372  --   --   --   APTT 38* 34  --  136*  LABPROT 16.1*  --   --   --   INR 1.28  --   --   --   HEPARINUNFRC 0.47  --  0.35  --   CREATININE 0.86  --   --   --     Estimated Creatinine Clearance: 98 mL/min (by C-G formula based on Cr of 0.86).   Medical History: Past Medical History  Diagnosis Date  . Hernia, inguinal   . Arthritis   . Restless leg syndrome   . Depression    Assessment: 65 year old male with LLE DVT diagnosed on 05/25/15. Xarelto was initiated that day and referred to Dr. Darrick Penna. On 05/31/15, Xarelto was held and patient was transitioned to IV heparin this AM (1/27) for  thrombolysis of clot. Last dose of Xarelto was 05/31/15 at 0600 AM per patient report.   Baseline aPTT 38, HL 0.47 - follow aPTTs until correlating.  Hg low 11, plt wnl.  No bleed per RN however feels blood on blood draws is very diluted.   Repeat aPTT elevated post-procedure at 136 - Patient now on tenectaplase.   Goal of Therapy:  Heparin level 0.2 to 0.5 units/ml aPTT 44-85 seconds Monitor platelets by anticoagulation protocol: Yes   Plan:  Hold heparin x1.5 hours (communicated with RN), then restart at lower rate of 1000 units/hr. 6h aPTT, Daily heparin level and CBC  Monitor for signs and symptoms of bleeding   Link Snuffer, PharmD, BCPS Clinical Pharmacist [phone]  06/01/2015 9:32 PM

## 2015-06-01 NOTE — Progress Notes (Signed)
Pt swelling similar to yesterday.  Pain controlled with IV pain meds Left foot pink warm palpable DP pulse  Thrombolysis to begin later today per IR  Fabienne Bruns, MD [facility] Office: [phone] Pager: [phone]

## 2015-06-01 NOTE — Sedation Documentation (Signed)
Vital signs stable. Pt complains of L Leg pain when MD presses on leg.

## 2015-06-01 NOTE — Procedures (Signed)
Successful LLE infusion cath insertion for occlusive iliofemoral, popliteal DVT.  Thrombus extends into the lower IVC also No comp Stable TNK infusion thru sheath and infusion catheter  followup imaging tomorrow with possible additional intervention Full report in PACS

## 2015-06-01 NOTE — Sedation Documentation (Signed)
Patient is resting comfortably. No complaints of pain. Vitals stable. 

## 2015-06-02 ENCOUNTER — Inpatient Hospital Stay (HOSPITAL_COMMUNITY): Payer: BLUE CROSS/BLUE SHIELD

## 2015-06-02 DIAGNOSIS — I82402 Acute embolism and thrombosis of unspecified deep veins of left lower extremity: Secondary | ICD-10-CM

## 2015-06-02 LAB — CBC
HCT: 33.7 % — ABNORMAL LOW (ref 39.0–52.0)
Hemoglobin: 11.3 g/dL — ABNORMAL LOW (ref 13.0–17.0)
MCH: 27.8 pg (ref 26.0–34.0)
MCHC: 33.5 g/dL (ref 30.0–36.0)
MCV: 83 fL (ref 78.0–100.0)
PLATELETS: 244 10*3/uL (ref 150–400)
RBC: 4.06 MIL/uL — ABNORMAL LOW (ref 4.22–5.81)
RDW: 12.9 % (ref 11.5–15.5)
WBC: 12.9 10*3/uL — AB (ref 4.0–10.5)

## 2015-06-02 LAB — GLUCOSE, CAPILLARY
GLUCOSE-CAPILLARY: 170 mg/dL — AB (ref 65–99)
Glucose-Capillary: 106 mg/dL — ABNORMAL HIGH (ref 65–99)
Glucose-Capillary: 107 mg/dL — ABNORMAL HIGH (ref 65–99)

## 2015-06-02 LAB — BASIC METABOLIC PANEL
ANION GAP: 9 (ref 5–15)
BUN: 14 mg/dL (ref 6–20)
CALCIUM: 8.6 mg/dL — AB (ref 8.9–10.3)
CO2: 25 mmol/L (ref 22–32)
Chloride: 101 mmol/L (ref 101–111)
Creatinine, Ser: 0.86 mg/dL (ref 0.61–1.24)
GFR calc Af Amer: >60 mL/min (ref >60)
GLUCOSE: 110 mg/dL — AB (ref 65–99)
Potassium: 4 mmol/L (ref 3.5–5.1)
Sodium: 135 mmol/L (ref 135–145)

## 2015-06-02 LAB — APTT
aPTT: 77 seconds — ABNORMAL HIGH (ref 24–37)
aPTT: 67 seconds — ABNORMAL HIGH (ref 24–37)

## 2015-06-02 LAB — FIBRINOGEN
Fibrinogen: <60 mg/dL — CL (ref 204–475)
Fibrinogen: <60 mg/dL — CL (ref 204–475)

## 2015-06-02 MED ORDER — MIDAZOLAM HCL 2 MG/2ML IJ SOLN
INTRAMUSCULAR | Status: AC
  Filled 2015-06-02: qty 2

## 2015-06-02 MED ORDER — MIDAZOLAM HCL 2 MG/2ML IJ SOLN
INTRAMUSCULAR | Status: AC | PRN
  Administered 2015-06-02: 1 mg via INTRAVENOUS
  Administered 2015-06-02 (×4): 0.5 mg via INTRAVENOUS
  Administered 2015-06-02: 1 mg via INTRAVENOUS

## 2015-06-02 MED ORDER — FENTANYL CITRATE (PF) 100 MCG/2ML IJ SOLN
INTRAMUSCULAR | Status: AC
Start: 2015-06-02 — End: 2015-06-02
  Filled 2015-06-02: qty 2

## 2015-06-02 MED ORDER — SODIUM CHLORIDE 0.9 % IV SOLN
INTRAVENOUS | Status: DC
  Administered 2015-06-02: 25 mL/h via INTRAVENOUS

## 2015-06-02 MED ORDER — FENTANYL CITRATE (PF) 100 MCG/2ML IJ SOLN
INTRAMUSCULAR | Status: AC | PRN
  Administered 2015-06-02 (×2): 50 ug via INTRAVENOUS
  Administered 2015-06-02 (×4): 25 ug via INTRAVENOUS

## 2015-06-02 MED ORDER — MORPHINE SULFATE (PF) 4 MG/ML IV SOLN
INTRAVENOUS | Status: AC
  Filled 2015-06-02: qty 1

## 2015-06-02 MED ORDER — MORPHINE SULFATE 10 MG/ML IJ SOLN
INTRAMUSCULAR | Status: AC | PRN
  Administered 2015-06-02 (×2): 2 mg via INTRAVENOUS

## 2015-06-02 MED ORDER — LIDOCAINE HCL 1 % IJ SOLN
INTRAMUSCULAR | Status: AC
  Filled 2015-06-02: qty 20

## 2015-06-02 MED ORDER — FENTANYL CITRATE (PF) 100 MCG/2ML IJ SOLN
INTRAMUSCULAR | Status: AC
  Filled 2015-06-02: qty 2

## 2015-06-02 MED ORDER — HEPARIN (PORCINE) IN NACL 100-0.45 UNIT/ML-% IJ SOLN
1300.0000 [IU]/h | INTRAMUSCULAR | Status: DC
  Administered 2015-06-02: 1100 [IU]/h via INTRAVENOUS
  Filled 2015-06-02 (×3): qty 250

## 2015-06-02 MED ORDER — HEPARIN (PORCINE) IN NACL 2-0.9 UNIT/ML-% IJ SOLN: INTRAMUSCULAR | Status: DC

## 2015-06-02 MED ORDER — SODIUM CHLORIDE 0.9 % IV SOLN
INTRAVENOUS | Status: AC | PRN
  Administered 2015-06-02: 10 mL/h via INTRAVENOUS

## 2015-06-02 MED ORDER — IOHEXOL 300 MG/ML  SOLN
50.0000 mL | Freq: Once | INTRAMUSCULAR | Status: AC | PRN
  Administered 2015-06-02: 50 mL via INTRAVENOUS

## 2015-06-02 MED ORDER — IOHEXOL 300 MG/ML  SOLN
100.0000 mL | Freq: Once | INTRAMUSCULAR | Status: AC | PRN
  Administered 2015-06-02: 100 mL via INTRAVENOUS

## 2015-06-02 NOTE — Progress Notes (Signed)
Subjective: Interval History: none.. Still with swelling in his left leg. My first examination of the patient. Patient does not seem to see much change.  Objective: Vital signs in last 24 hours: Temp:  [98.2 F (36.8 C)-102.7 F (39.3 C)] 99.8 F (37.7 C) (01/28 0804) Pulse Rate:  [73-102] 84 (01/28 0700) Resp:  [12-23] 15 (01/28 0700) BP: (92-162)/(61-95) 119/68 mmHg (01/28 0700) SpO2:  [91 %-100 %] 93 % (01/28 0700)  Intake/Output from previous day: 01/27 0701 - 01/28 0700 In: 1902 [I.V.:427; IV Piggyback:1475] Out: 1615 [Urine:1615] Intake/Output this shift:    2+ left dorsalis pedis pulse. Leg swollen but not tight.  Lab Results:  Recent Labs  05/31/15 1935 06/02/15 0438  WBC 9.2 12.9*  HGB 11.0* 11.3*  HCT 32.9* 33.7*  PLT 372 244   BMET  Recent Labs  05/31/15 1935 06/02/15 0438  NA 137 135  K 3.9 4.0  CL 98* 101  CO2 28 25  GLUCOSE 116* 110*  BUN 15 14  CREATININE 0.86 0.86  CALCIUM 9.0 8.6*    Studies/Results: Ir Veno/ext/uni Left  06/01/2015  INDICATION: Acute symptomatic left femoral popliteal DVT, not responding to Xarelto. EXAM: IR INFUSION THROMBOL VENOUS INITIAL (MS); LEFT EXTREMITY VENOGRAPHY; IR ULTRASOUND GUIDANCE VASC ACCESS LEFT; INFERIOR VENA CAVOGRAM COMPARISON:  None. MEDICATIONS: 1% lidocaine locally, sedation minutes below. ANESTHESIA/SEDATION: Versed 2 mg IV; Fentanyl 75 mcg IV Moderate Sedation Time:  28 The patient was continuously monitored during the procedure by the interventional radiology nurse under my direct supervision. FLUOROSCOPY TIME:  Fluoroscopy Time: 5 minutes 12 seconds (61 mGy). COMPLICATIONS: None immediate. TECHNIQUE: Informed written consent was obtained from the patient after a thorough discussion of the procedural risks, benefits and alternatives. All questions were addressed. Maximal Sterile Barrier Technique was utilized including caps, mask, sterile gowns, sterile gloves, sterile drape, hand hygiene and skin  antiseptic. A timeout was performed prior to the initiation of the procedure. Under sterile conditions and local anesthesia, ultrasound percutaneous access performed of the thrombosed left popliteal vein below the knee. Guidewire advanced under fluoroscopy. Micro dilator inserted. Contrast injection confirms occlusive left femoral popliteal DVT. Six French sheath inserted over a Bentson guidewire. Vert catheter and Glidewire utilized to manipulate the access through the left femoral thrombotic occlusion. Contrast injections along the way also demonstrate thrombotic occlusion of the iliac veins on the left. Catheter was advanced into the IVC. Contrast injection performed of the lower IVC. Nonocclusive thrombus also present in the lower IVC below the renal veins. Exchange Bentson guidewire inserted into the IVC. A 135 cm infusion catheter with a 50 cm infusion length was advanced. Tip position in the lower IVC. Distal infusion marker within the peripheral femoral vein. Position confirmed with fluoroscopy. Images obtained for documentation. FINDINGS: Left lower extremity and IVC venograms confirm acute occlusive left iliac, femoral and popliteal DVT. There is propagation of clot into the lower IVC which is nonocclusive. Successful insertion of an infusion catheter with a 50 cm infusion length to begin catheter thrombo lysis, both through the access sheath and the infusion catheter. IMPRESSION: Successful insertion of a left lower extremity infusion catheter for treatment of the acute occlusive left iliac, femoral and popliteal DVT. There is also nonocclusive clot propagation into the lower IVC. PLAN: TNK thrombo lysis will be initiated at 1 milligram/hour through the infusion catheter and 0.5 milligram/hour through the access sheath. Follow-up venogram perform tomorrow. Electronically Signed   By: Judie Petit.  Shick M.D.   On: 06/01/2015 12:53   Ir Caffie Damme  Ivc  06/01/2015  INDICATION: Acute symptomatic left femoral  popliteal DVT, not responding to Xarelto. EXAM: IR INFUSION THROMBOL VENOUS INITIAL (MS); LEFT EXTREMITY VENOGRAPHY; IR ULTRASOUND GUIDANCE VASC ACCESS LEFT; INFERIOR VENA CAVOGRAM COMPARISON:  None. MEDICATIONS: 1% lidocaine locally, sedation minutes below. ANESTHESIA/SEDATION: Versed 2 mg IV; Fentanyl 75 mcg IV Moderate Sedation Time:  28 The patient was continuously monitored during the procedure by the interventional radiology nurse under my direct supervision. FLUOROSCOPY TIME:  Fluoroscopy Time: 5 minutes 12 seconds (61 mGy). COMPLICATIONS: None immediate. TECHNIQUE: Informed written consent was obtained from the patient after a thorough discussion of the procedural risks, benefits and alternatives. All questions were addressed. Maximal Sterile Barrier Technique was utilized including caps, mask, sterile gowns, sterile gloves, sterile drape, hand hygiene and skin antiseptic. A timeout was performed prior to the initiation of the procedure. Under sterile conditions and local anesthesia, ultrasound percutaneous access performed of the thrombosed left popliteal vein below the knee. Guidewire advanced under fluoroscopy. Micro dilator inserted. Contrast injection confirms occlusive left femoral popliteal DVT. Six French sheath inserted over a Bentson guidewire. Vert catheter and Glidewire utilized to manipulate the access through the left femoral thrombotic occlusion. Contrast injections along the way also demonstrate thrombotic occlusion of the iliac veins on the left. Catheter was advanced into the IVC. Contrast injection performed of the lower IVC. Nonocclusive thrombus also present in the lower IVC below the renal veins. Exchange Bentson guidewire inserted into the IVC. A 135 cm infusion catheter with a 50 cm infusion length was advanced. Tip position in the lower IVC. Distal infusion marker within the peripheral femoral vein. Position confirmed with fluoroscopy. Images obtained for documentation. FINDINGS:  Left lower extremity and IVC venograms confirm acute occlusive left iliac, femoral and popliteal DVT. There is propagation of clot into the lower IVC which is nonocclusive. Successful insertion of an infusion catheter with a 50 cm infusion length to begin catheter thrombo lysis, both through the access sheath and the infusion catheter. IMPRESSION: Successful insertion of a left lower extremity infusion catheter for treatment of the acute occlusive left iliac, femoral and popliteal DVT. There is also nonocclusive clot propagation into the lower IVC. PLAN: TNK thrombo lysis will be initiated at 1 milligram/hour through the infusion catheter and 0.5 milligram/hour through the access sheath. Follow-up venogram perform tomorrow. Electronically Signed   By: Judie Petit.  Shick M.D.   On: 06/01/2015 12:53   Ct Femur Left Wo Contrast  05/30/2015  CLINICAL DATA:  Left groin and thigh pain. History of blood clot (DVT?) on 05/25/2015. Pain for 10 days. Assessment for fracture. Prior hernia repair with mesh. EXAM: CT OF THE LEFT FEMUR WITHOUT CONTRAST TECHNIQUE: Multidetector CT imaging was performed according to the standard protocol. Multiplanar CT image reconstructions were also generated. COMPARISON:  None. FINDINGS: Low-level presacral edema in the pelvis. There stranding around the external iliac artery and vein, common iliac artery and vein, and superficial femoral artery and vein, with the vein demonstrating higher luminal density than the artery throughout this entire course. This is most confluent overlying the external iliac vessels as on image 1 series 5. Some of the stranding along the left groin may be from the reported prior hernia repair. I do not see a significant recurrent hernia. An injury along the hernia repair could also cause stranding along the inguinal region shown on track today's exam but is considered unlikely. Subcutaneous edema noted in the thigh and lateral to the left hip. Trace knee effusion. There is  degenerative spurring  of the acetabulum and femoral head. Synovial herniation pit at the junction of the left femoral head and neck. Chronically fragmented acetabular spur anteriorly. No femur fracture identified. No CT findings of iliopsoas avulsion. No compelling findings of hamstring avulsion. No hip effusion. IMPRESSION: 1. Abnormal difference in density between the left external iliac, common femoral, and superficial femoral and popliteal artery and vein, possible causes diffuse left lower extremity DVT versus less likely occlusion of the left femoral artery. Normally on a noncontrast exam these 2 vessels would be similar in density. There is abnormal stranding around the vascular structures which would seem to favor DVT. Correlate with any recent sonography. 2. Hematoma or confluent edema superficial to the external iliac vessels on image 1 series 5. There is some stranding along the left inguinal ring which could be postoperative or less likely due to an injury at prior operative site in this vicinity. However this stranding is not really out of proportion to stranding elsewhere in the lower pelvis. 3. Subcutaneous edema in the thigh and lateral to the left hip, nonspecific. 4. No fracture or acute bony findings. Degenerative arthropathy of the left hip. 5. Trace knee effusion. Electronically Signed   By: Gaylyn Rong M.D.   On: 05/30/2015 08:55   Ir US Guide Vasc Access Left  06/01/2015  INDICATION: Acute symptomatic left femoral popliteal DVT, not responding to Xarelto. EXAM: IR INFUSION THROMBOL VENOUS INITIAL (MS); LEFT EXTREMITY VENOGRAPHY; IR ULTRASOUND GUIDANCE VASC ACCESS LEFT; INFERIOR VENA CAVOGRAM COMPARISON:  None. MEDICATIONS: 1% lidocaine locally, sedation minutes below. ANESTHESIA/SEDATION: Versed 2 mg IV; Fentanyl 75 mcg IV Moderate Sedation Time:  28 The patient was continuously monitored during the procedure by the interventional radiology nurse under my direct supervision.  FLUOROSCOPY TIME:  Fluoroscopy Time: 5 minutes 12 seconds (61 mGy). COMPLICATIONS: None immediate. TECHNIQUE: Informed written consent was obtained from the patient after a thorough discussion of the procedural risks, benefits and alternatives. All questions were addressed. Maximal Sterile Barrier Technique was utilized including caps, mask, sterile gowns, sterile gloves, sterile drape, hand hygiene and skin antiseptic. A timeout was performed prior to the initiation of the procedure. Under sterile conditions and local anesthesia, ultrasound percutaneous access performed of the thrombosed left popliteal vein below the knee. Guidewire advanced under fluoroscopy. Micro dilator inserted. Contrast injection confirms occlusive left femoral popliteal DVT. Six French sheath inserted over a Bentson guidewire. Vert catheter and Glidewire utilized to manipulate the access through the left femoral thrombotic occlusion. Contrast injections along the way also demonstrate thrombotic occlusion of the iliac veins on the left. Catheter was advanced into the IVC. Contrast injection performed of the lower IVC. Nonocclusive thrombus also present in the lower IVC below the renal veins. Exchange Bentson guidewire inserted into the IVC. A 135 cm infusion catheter with a 50 cm infusion length was advanced. Tip position in the lower IVC. Distal infusion marker within the peripheral femoral vein. Position confirmed with fluoroscopy. Images obtained for documentation. FINDINGS: Left lower extremity and IVC venograms confirm acute occlusive left iliac, femoral and popliteal DVT. There is propagation of clot into the lower IVC which is nonocclusive. Successful insertion of an infusion catheter with a 50 cm infusion length to begin catheter thrombo lysis, both through the access sheath and the infusion catheter. IMPRESSION: Successful insertion of a left lower extremity infusion catheter for treatment of the acute occlusive left iliac, femoral  and popliteal DVT. There is also nonocclusive clot propagation into the lower IVC. PLAN: TNK thrombo lysis will be  initiated at 1 milligram/hour through the infusion catheter and 0.5 milligram/hour through the access sheath. Follow-up venogram perform tomorrow. Electronically Signed   By: Judie Petit.  Shick M.D.   On: 06/01/2015 12:53   Ir Infusion Thrombol Venous Initial (ms)  06/01/2015  INDICATION: Acute symptomatic left femoral popliteal DVT, not responding to Xarelto. EXAM: IR INFUSION THROMBOL VENOUS INITIAL (MS); LEFT EXTREMITY VENOGRAPHY; IR ULTRASOUND GUIDANCE VASC ACCESS LEFT; INFERIOR VENA CAVOGRAM COMPARISON:  None. MEDICATIONS: 1% lidocaine locally, sedation minutes below. ANESTHESIA/SEDATION: Versed 2 mg IV; Fentanyl 75 mcg IV Moderate Sedation Time:  28 The patient was continuously monitored during the procedure by the interventional radiology nurse under my direct supervision. FLUOROSCOPY TIME:  Fluoroscopy Time: 5 minutes 12 seconds (61 mGy). COMPLICATIONS: None immediate. TECHNIQUE: Informed written consent was obtained from the patient after a thorough discussion of the procedural risks, benefits and alternatives. All questions were addressed. Maximal Sterile Barrier Technique was utilized including caps, mask, sterile gowns, sterile gloves, sterile drape, hand hygiene and skin antiseptic. A timeout was performed prior to the initiation of the procedure. Under sterile conditions and local anesthesia, ultrasound percutaneous access performed of the thrombosed left popliteal vein below the knee. Guidewire advanced under fluoroscopy. Micro dilator inserted. Contrast injection confirms occlusive left femoral popliteal DVT. Six French sheath inserted over a Bentson guidewire. Vert catheter and Glidewire utilized to manipulate the access through the left femoral thrombotic occlusion. Contrast injections along the way also demonstrate thrombotic occlusion of the iliac veins on the left. Catheter was advanced  into the IVC. Contrast injection performed of the lower IVC. Nonocclusive thrombus also present in the lower IVC below the renal veins. Exchange Bentson guidewire inserted into the IVC. A 135 cm infusion catheter with a 50 cm infusion length was advanced. Tip position in the lower IVC. Distal infusion marker within the peripheral femoral vein. Position confirmed with fluoroscopy. Images obtained for documentation. FINDINGS: Left lower extremity and IVC venograms confirm acute occlusive left iliac, femoral and popliteal DVT. There is propagation of clot into the lower IVC which is nonocclusive. Successful insertion of an infusion catheter with a 50 cm infusion length to begin catheter thrombo lysis, both through the access sheath and the infusion catheter. IMPRESSION: Successful insertion of a left lower extremity infusion catheter for treatment of the acute occlusive left iliac, femoral and popliteal DVT. There is also nonocclusive clot propagation into the lower IVC. PLAN: TNK thrombo lysis will be initiated at 1 milligram/hour through the infusion catheter and 0.5 milligram/hour through the access sheath. Follow-up venogram perform tomorrow. Electronically Signed   By: Judie Petit.  Shick M.D.   On: 06/01/2015 12:53   Anti-infectives: Anti-infectives    None      Assessment/Plan: s/p * No surgery found * Extensive DVT undergoing lysis directed by interventional radiology. Or reimaging this morning.   LOS: 2 days   Gretta Began 06/02/2015, 8:34 AM

## 2015-06-02 NOTE — Progress Notes (Signed)
TNK infusions stopped per Dr. Bryson Dames post critical fibrinogen level. Per order, NS 0.9% 71ml/hr to infusion cath to DVT, and NS 0.9% 52ml/hr to access sheath.  Will continue to monitor.

## 2015-06-02 NOTE — Sedation Documentation (Signed)
Dr Miles Costain notified of fibrinogen <60

## 2015-06-02 NOTE — Progress Notes (Signed)
CRITICAL VALUE ALERT  Critical value received: fibrinogen  Date of notification:  06/02/15  Time of notification:0225  Critical value read back: yes  Nurse who received alert: Vilinda Blanks  MD notified (1st page):  Dr Bryson Dames  Time of first page: 0230  MD notified (2nd page):NA  Time of second page: NA  Responding MD:  Dr Bryson Dames  Time MD responded:  [phone]

## 2015-06-02 NOTE — Progress Notes (Signed)
CRITICAL VALUE ALERT  Critical value received: fibrinogen  Date of notification:  06/02/15   Time of notification:  0640  Critical value read back:yes Nurse who received alert:  Vilinda Blanks, RN  MD notified (1st page):  [phone]  Time of first page:  NA  MD notified (2nd page): NA  Time of second page: NA  Responding MD:  Dr Bryson Dames  Time MD responded:  [phone]

## 2015-06-02 NOTE — Procedures (Signed)
Successful LLE DVT LYSIS, THROMBECTOMY, AND VENOUS 10, 12, OVERLAPPING PTA Near complete resolution of iliofemoral DVT, with a restored channel of venouw outflow No comp Stable Cont heparin IV, start po anticoagulation, compression hose to left leg, and ambulate in 3 hours

## 2015-06-02 NOTE — Progress Notes (Signed)
Transported to Radiology

## 2015-06-02 NOTE — Progress Notes (Signed)
ANTICOAGULATION CONSULT NOTE - Follow Up Consult  Pharmacy Consult for heparin Indication: DVT  Labs:  Recent Labs  05/31/15 1935 06/01/15 1516 06/01/15 1818 06/01/15 2045 06/02/15 0438  HGB 11.0*  --   --   --  11.3*  HCT 32.9*  --   --   --  33.7*  PLT 372  --   --   --  244  APTT 38* 34  --  136* 77*  LABPROT 16.1*  --   --   --   --   INR 1.28  --   --   --   --   HEPARINUNFRC 0.47  --  0.35  --   --   CREATININE 0.86  --   --   --  0.86    Assessment/Plan:  65yo male therapeutic on heparin after rate change. Will continue gtt at current rate and confirm stable with additional PTT.   Vernard Gambles, PharmD, BCPS  06/02/2015,5:26 AM

## 2015-06-02 NOTE — Progress Notes (Signed)
ANTICOAGULATION CONSULT NOTE  Pharmacy Consult for Heparin Indication: DVT  No Known Allergies  Patient Measurements: Weight: 184 lb 4.9 oz (83.6 kg)  Height 184.2 cm Heparin Dosing Weight: using actual weight  Vital Signs: Temp: 99.8 F (37.7 C) (01/28 0804) Temp Source: Oral (01/28 0804) BP: 126/77 mmHg (01/28 1105) Pulse Rate: 89 (01/28 1105)  Labs:  Recent Labs  05/31/15 1935  06/01/15 1818 06/01/15 2045 06/02/15 0438 06/02/15 0935  HGB 11.0*  --   --   --  11.3*  --   HCT 32.9*  --   --   --  33.7*  --   PLT 372  --   --   --  244  --   APTT 38*  < >  --  136* 77* 67*  LABPROT 16.1*  --   --   --   --   --   INR 1.28  --   --   --   --   --   HEPARINUNFRC 0.47  --  0.35  --   --   --   CREATININE 0.86  --   --   --  0.86  --   < > = values in this interval not displayed.  Estimated Creatinine Clearance: 98 mL/min (by C-G formula based on Cr of 0.86).   Medical History: Past Medical History  Diagnosis Date  . Hernia, inguinal   . Arthritis   . Restless leg syndrome   . Depression    Assessment: 65 year old male with LLE DVT diagnosed on 05/25/15. Xarelto was initiated that day and referred to Dr. Darrick Penna. On 05/31/15, Xarelto was held and patient was transitioned to IV heparin on 1/27 for thrombolysis of clot. Last dose of Xarelto was 05/31/15 at 0600 AM per patient report.   Pt completed thrombolysis with TNKase at 0200 this morning, and had thrombectomy. aPTTs have been therapeutic x2. CBC stable.  Goal of Therapy:  aPTT 66-102   Heparin level 0.3-0.7 units/ml Monitor platelets by anticoagulation protocol: Yes   Plan:  -Increase heparin to 1100 units/hr -Daily HL, CBC, aptt -Monitor s/sx bleeding -Should be able to transition to PO in ~ 24hours -Increase heparin goals back to normal therapeutic goals   Agapito Games, PharmD, BCPS Clinical Pharmacist Pager: [phone] 06/02/2015 11:11 AM

## 2015-06-02 NOTE — Sedation Documentation (Signed)
All drugs ordered BY Dr Miles Costain Given by Donnella Bi RN

## 2015-06-03 LAB — CBC
HEMATOCRIT: 28.3 % — AB (ref 39.0–52.0)
Hemoglobin: 10.1 g/dL — ABNORMAL LOW (ref 13.0–17.0)
MCH: 29.2 pg (ref 26.0–34.0)
MCHC: 35.7 g/dL (ref 30.0–36.0)
MCV: 81.8 fL (ref 78.0–100.0)
Platelets: 227 10*3/uL (ref 150–400)
RBC: 3.46 MIL/uL — ABNORMAL LOW (ref 4.22–5.81)
RDW: 12.9 % (ref 11.5–15.5)
WBC: 10.8 10*3/uL — ABNORMAL HIGH (ref 4.0–10.5)

## 2015-06-03 LAB — GLUCOSE, CAPILLARY
Glucose-Capillary: 123 mg/dL — ABNORMAL HIGH (ref 65–99)
Glucose-Capillary: 130 mg/dL — ABNORMAL HIGH (ref 65–99)

## 2015-06-03 LAB — HEPARIN LEVEL (UNFRACTIONATED)
HEPARIN UNFRACTIONATED: 0.19 [IU]/mL — AB (ref 0.30–0.70)
HEPARIN UNFRACTIONATED: 0.21 [IU]/mL — AB (ref 0.30–0.70)

## 2015-06-03 LAB — APTT
APTT: 45 s — AB (ref 24–37)
aPTT: 54 seconds — ABNORMAL HIGH (ref 24–37)

## 2015-06-03 MED ORDER — ROPINIROLE HCL 1 MG PO TABS: 0.5000 mg | ORAL_TABLET | Freq: Every day | ORAL | Status: DC

## 2015-06-03 MED ORDER — PAROXETINE HCL 10 MG PO TABS
10.0000 mg | ORAL_TABLET | Freq: Every day | ORAL | Status: DC
  Filled 2015-06-03: qty 1

## 2015-06-03 MED ORDER — HEPARIN (PORCINE) IN NACL 100-0.45 UNIT/ML-% IJ SOLN
1500.0000 [IU]/h | INTRAMUSCULAR | Status: DC
  Filled 2015-06-03: qty 250

## 2015-06-03 MED ORDER — HYDROCODONE-ACETAMINOPHEN 5-325 MG PO TABS
1.0000 | ORAL_TABLET | Freq: Four times a day (QID) | ORAL | Status: DC | PRN
  Administered 2015-06-03 – 2015-06-04 (×3): 1 via ORAL
  Filled 2015-06-03 (×4): qty 1

## 2015-06-03 NOTE — Progress Notes (Addendum)
ANTICOAGULATION CONSULT NOTE  Pharmacy Consult for Heparin Indication: DVT  No Known Allergies  Patient Measurements: Height:  (185.4 cm) Weight: 184 lb 4.9 oz (83.6 kg) IBW/kg (Calculated) : 79.9  Heparin Dosing Weight: using actual weight  Vital Signs: Temp: 98.2 F (36.8 C) (01/29 0752) Temp Source: Oral (01/29 0752) BP: 119/59 mmHg (01/29 0700) Pulse Rate: 83 (01/29 0700)  Labs:  Recent Labs  05/31/15 1935  06/01/15 1818  06/02/15 0438 06/02/15 0935 06/03/15 0735  HGB 11.0*  --   --   --  11.3*  --  10.1*  HCT 32.9*  --   --   --  33.7*  --  28.3*  PLT 372  --   --   --  244  --  227  APTT 38*  < >  --   < > 77* 67* 54*  LABPROT 16.1*  --   --   --   --   --   --   INR 1.28  --   --   --   --   --   --   HEPARINUNFRC 0.47  --  0.35  --   --   --  0.19*  CREATININE 0.86  --   --   --  0.86  --   --   < > = values in this interval not displayed.  Estimated Creatinine Clearance: 99.4 mL/min (by C-G formula based on Cr of 0.86).   Medical History: Past Medical History  Diagnosis Date  . Hernia, inguinal   . Arthritis   . Restless leg syndrome   . Depression    Assessment: 65 year old male with LLE DVT diagnosed on 05/25/15. Xarelto was initiated that day and referred to Dr. Darrick Penna. On 05/31/15, Xarelto was held and patient was transitioned to IV heparin. Last dose of Xarelto was 05/31/15 at 0600 AM per patient report.   Pt underwent catheter-directed thrombolysis with TNKase which completed at 0200 on 1/28, pt also had thrombectomy. CBC remains stable. aptt and heparin level were both subtherapeutic this morning.   Goal of Therapy:  aPTT 66-102   Heparin level 0.3-0.7 units/ml Monitor platelets by anticoagulation protocol: Yes   Plan:  -Increase heparin to 1300 units/hr -Daily HL, CBC -Monitor s/sx bleeding -F/u plan for PO anticoagulation -Check levels this afternoon   Agapito Games, PharmD, BCPS Clinical Pharmacist Pager: [phone] 06/03/2015  9:44 AM    Addendum: Heparin level and aPTT both remain subtherapeutic despite rate increase earlier today. Will stop checking aPTTs as it appears xarelto has cleared.  Plan: Increase heparin to 1500 units/hr Check 6 hour heparin level  Louie Casa, PharmD, BCPS 06/03/2015, 4:44 PM

## 2015-06-03 NOTE — Progress Notes (Signed)
Referring Physician(s): Dr. Darrick Penna  Chief Complaint: (L)LE DVT  Subjective: Pt s/p completion of LLE DVT lysis with long segment venous PTA. Feels well this am. Some soreness at infrapopliteal puncture site. Leg edema some better.  Allergies: Review of patient's allergies indicates no known allergies.  Medications:  Current facility-administered medications:  .  0.9 %  sodium chloride infusion, 250 mL, Intravenous, PRN, Berdine Dance, MD .  acetaminophen (TYLENOL) tablet 325-650 mg, 325-650 mg, Oral, Q4H PRN, 650 mg at 06/01/15 2032 **OR** acetaminophen (TYLENOL) suppository 325-650 mg, 325-650 mg, Rectal, Q4H PRN, Samantha J Rhyne, PA-C .  alum & mag hydroxide-simeth (MAALOX/MYLANTA) 200-200-20 MG/5ML suspension 15-30 mL, 15-30 mL, Oral, Q2H PRN, Samantha J Rhyne, PA-C .  bisacodyl (DULCOLAX) suppository 10 mg, 10 mg, Rectal, Daily PRN, Samantha J Rhyne, PA-C .  guaiFENesin-dextromethorphan (ROBITUSSIN DM) 100-10 MG/5ML syrup 15 mL, 15 mL, Oral, Q4H PRN, Samantha J Rhyne, PA-C .  heparin ADULT infusion 100 units/mL (25000 units/250 mL), 1,300 Units/hr, Intravenous, Continuous, Darl Householder Masters, RPH, Last Rate: 13 mL/hr at 06/03/15 1000, 1,300 Units/hr at 06/03/15 1000 .  hydrALAZINE (APRESOLINE) injection 5 mg, 5 mg, Intravenous, Q20 Min PRN, Samantha J Rhyne, PA-C .  labetalol (NORMODYNE,TRANDATE) injection 10 mg, 10 mg, Intravenous, Q10 min PRN, Samantha J Rhyne, PA-C .  magnesium hydroxide (MILK OF MAGNESIA) suspension 30 mL, 30 mL, Oral, Daily PRN, Samantha J Rhyne, PA-C .  metoprolol (LOPRESSOR) injection 2-5 mg, 2-5 mg, Intravenous, Q2H PRN, Samantha J Rhyne, PA-C .  morphine 2 MG/ML injection 2-3 mg, 2-3 mg, Intravenous, Q2H PRN, Ames Coupe Rhyne, PA-C, 3 mg at 06/03/15 0704 .  ondansetron (ZOFRAN) injection 4 mg, 4 mg, Intravenous, Q6H PRN, Samantha J Rhyne, PA-C .  oxyCODONE (Oxy IR/ROXICODONE) immediate release tablet 5-10 mg, 5-10 mg, Oral, Q4H PRN, Samantha J Rhyne,  PA-C, 5 mg at 06/03/15 1102 .  pantoprazole (PROTONIX) EC tablet 40 mg, 40 mg, Oral, Daily, Samantha J Rhyne, PA-C, 40 mg at 06/01/15 0906 .  phenol (CHLORASEPTIC) mouth spray 1 spray, 1 spray, Mouth/Throat, PRN, Samantha J Rhyne, PA-C .  rOPINIRole (REQUIP) tablet 1 mg, 1 mg, Oral, Q2200, Samantha J Rhyne, PA-C, 1 mg at 05/31/15 2024 .  sodium chloride flush (NS) 0.9 % injection 3 mL, 3 mL, Intravenous, Q12H, Berdine Dance, MD, 10 mL at 06/02/15 2200 .  sodium chloride flush (NS) 0.9 % injection 3 mL, 3 mL, Intravenous, PRN, Berdine Dance, MD    Vital Signs: BP 127/65 mmHg  Pulse 86  Temp(Src) 98.2 F (36.8 C) (Oral)  Resp 17  Ht 6\' 1"  (1.854 m)  Wt 184 lb 4.9 oz (83.6 kg)  BMI 24.32 kg/m2  SpO2 97%  Physical Exam  Constitutional: He appears well-developed. No distress.  Cardiovascular: Normal rate, regular rhythm and normal heart sounds.   Pulmonary/Chest: Effort normal and breath sounds normal. No respiratory distress.  Musculoskeletal:  (L)LE still 2-3+edema, but soft Puncture site soft, no hematoma Foot warm    Imaging: Ir Veno/ext/uni Left  06/01/2015  INDICATION: Acute symptomatic left femoral popliteal DVT, not responding to Xarelto. EXAM: IR INFUSION THROMBOL VENOUS INITIAL (MS); LEFT EXTREMITY VENOGRAPHY; IR ULTRASOUND GUIDANCE VASC ACCESS LEFT; INFERIOR VENA CAVOGRAM COMPARISON:  None. MEDICATIONS: 1% lidocaine locally, sedation minutes below. ANESTHESIA/SEDATION: Versed 2 mg IV; Fentanyl 75 mcg IV Moderate Sedation Time:  28 The patient was continuously monitored during the procedure by the interventional radiology nurse under my direct supervision. FLUOROSCOPY TIME:  Fluoroscopy Time: 5 minutes 12 seconds (61 mGy).  COMPLICATIONS: None immediate. TECHNIQUE: Informed written consent was obtained from the patient after a thorough discussion of the procedural risks, benefits and alternatives. All questions were addressed. Maximal Sterile Barrier Technique was utilized  including caps, mask, sterile gowns, sterile gloves, sterile drape, hand hygiene and skin antiseptic. A timeout was performed prior to the initiation of the procedure. Under sterile conditions and local anesthesia, ultrasound percutaneous access performed of the thrombosed left popliteal vein below the knee. Guidewire advanced under fluoroscopy. Micro dilator inserted. Contrast injection confirms occlusive left femoral popliteal DVT. Six French sheath inserted over a Bentson guidewire. Vert catheter and Glidewire utilized to manipulate the access through the left femoral thrombotic occlusion. Contrast injections along the way also demonstrate thrombotic occlusion of the iliac veins on the left. Catheter was advanced into the IVC. Contrast injection performed of the lower IVC. Nonocclusive thrombus also present in the lower IVC below the renal veins. Exchange Bentson guidewire inserted into the IVC. A 135 cm infusion catheter with a 50 cm infusion length was advanced. Tip position in the lower IVC. Distal infusion marker within the peripheral femoral vein. Position confirmed with fluoroscopy. Images obtained for documentation. FINDINGS: Left lower extremity and IVC venograms confirm acute occlusive left iliac, femoral and popliteal DVT. There is propagation of clot into the lower IVC which is nonocclusive. Successful insertion of an infusion catheter with a 50 cm infusion length to begin catheter thrombo lysis, both through the access sheath and the infusion catheter. IMPRESSION: Successful insertion of a left lower extremity infusion catheter for treatment of the acute occlusive left iliac, femoral and popliteal DVT. There is also nonocclusive clot propagation into the lower IVC. PLAN: TNK thrombo lysis will be initiated at 1 milligram/hour through the infusion catheter and 0.5 milligram/hour through the access sheath. Follow-up venogram perform tomorrow. Electronically Signed   By: Judie Petit.  Shick M.D.   On: 06/01/2015  12:53   Ir Caffie Damme Ivc  06/01/2015  INDICATION: Acute symptomatic left femoral popliteal DVT, not responding to Xarelto. EXAM: IR INFUSION THROMBOL VENOUS INITIAL (MS); LEFT EXTREMITY VENOGRAPHY; IR ULTRASOUND GUIDANCE VASC ACCESS LEFT; INFERIOR VENA CAVOGRAM COMPARISON:  None. MEDICATIONS: 1% lidocaine locally, sedation minutes below. ANESTHESIA/SEDATION: Versed 2 mg IV; Fentanyl 75 mcg IV Moderate Sedation Time:  28 The patient was continuously monitored during the procedure by the interventional radiology nurse under my direct supervision. FLUOROSCOPY TIME:  Fluoroscopy Time: 5 minutes 12 seconds (61 mGy). COMPLICATIONS: None immediate. TECHNIQUE: Informed written consent was obtained from the patient after a thorough discussion of the procedural risks, benefits and alternatives. All questions were addressed. Maximal Sterile Barrier Technique was utilized including caps, mask, sterile gowns, sterile gloves, sterile drape, hand hygiene and skin antiseptic. A timeout was performed prior to the initiation of the procedure. Under sterile conditions and local anesthesia, ultrasound percutaneous access performed of the thrombosed left popliteal vein below the knee. Guidewire advanced under fluoroscopy. Micro dilator inserted. Contrast injection confirms occlusive left femoral popliteal DVT. Six French sheath inserted over a Bentson guidewire. Vert catheter and Glidewire utilized to manipulate the access through the left femoral thrombotic occlusion. Contrast injections along the way also demonstrate thrombotic occlusion of the iliac veins on the left. Catheter was advanced into the IVC. Contrast injection performed of the lower IVC. Nonocclusive thrombus also present in the lower IVC below the renal veins. Exchange Bentson guidewire inserted into the IVC. A 135 cm infusion catheter with a 50 cm infusion length was advanced. Tip position in the lower IVC. Distal infusion marker  within the peripheral femoral  vein. Position confirmed with fluoroscopy. Images obtained for documentation. FINDINGS: Left lower extremity and IVC venograms confirm acute occlusive left iliac, femoral and popliteal DVT. There is propagation of clot into the lower IVC which is nonocclusive. Successful insertion of an infusion catheter with a 50 cm infusion length to begin catheter thrombo lysis, both through the access sheath and the infusion catheter. IMPRESSION: Successful insertion of a left lower extremity infusion catheter for treatment of the acute occlusive left iliac, femoral and popliteal DVT. There is also nonocclusive clot propagation into the lower IVC. PLAN: TNK thrombo lysis will be initiated at 1 milligram/hour through the infusion catheter and 0.5 milligram/hour through the access sheath. Follow-up venogram perform tomorrow. Electronically Signed   By: Judie Petit.  Shick M.D.   On: 06/01/2015 12:53   Ir Thrombect Prim Mech Add (inclu) Mod Sed  06/02/2015  INDICATION: Acute symptomatic left ileal femoral and popliteal DVT, 20 hours status post thrombo lysis, subsequent encounter. EXAM: IR THROMB F/U EVAL ART/VEN FINAL DAY; THROMBECTOMY MECHANICAL ; RADIOLOGY EXAMINATION COMPARISON:  06/01/2015 MEDICATIONS: 1% lidocaine locally ANESTHESIA/SEDATION: Versed 4 mg IV; Fentanyl 200 mcg IV, 4 mg morphine Moderate Sedation Time:  90 The patient was continuously monitored during the procedure by the interventional radiology nurse under my direct supervision. FLUOROSCOPY TIME:  Fluoroscopy Time: 24 minutes 42 seconds (35 mGy). COMPLICATIONS: None immediate. TECHNIQUE: Informed written consent was obtained from the patient after a thorough discussion of the procedural risks, benefits and alternatives. All questions were addressed. Maximal Sterile Barrier Technique was utilized including caps, mask, sterile gowns, sterile gloves, sterile drape, hand hygiene and skin antiseptic. A timeout was performed prior to the initiation of the procedure. Under  sterile conditions and local anesthesia, left lower extremity follow-up venogram performed through the existing popliteal sheath and the infusion catheter. Follow-up left lower extremity venogram: Following thrombo lysis, there is improvement in the left iliac, femoral popliteal occlusive DVT. Residual thrombus with stagnant flow is present from the iliac bifurcation to the knee. Areas of iliac and femoral venous narrowing noted as well. Mechanical thrombectomy: Over an Amplatz guidewire, the infusion catheter was removed. Sheath was exchanged for an 8 [place] sheath. Through the access, the Indigo CAT 8 suction thrombectomy catheter was passed several times throughout the iliac and femoral residual thrombus. A moderate volume of thrombus was able to be removed via the suction mechanical thrombectomy device. Approximately 350 cc blood volume aspirated during the mechanical thrombectomy. Follow-up venogram demonstrated continued improvement in the thrombus burden throughout the iliac and femoral segments. Scattered areas of adherent clot remain with partial luminal narrowing and persistent venous stasis. Venous angioplasty: For additional chronic adherent clot maceration and treatment of areas of residual venous stenosis, overlapping serial angioplasty was performed throughout the iliac and femoral veins initially with a 10 mm balloon. There are areas of persistent luminal narrowing. Additional overlapping angioplasty performed with a 12 mm and a 14 mm balloon. Following extensive serial overlapping angioplasty, final venograms are performed. Final venogram: following overnight thrombo lysis, mechanical thrombectomy, and serial 10 -14 mm overlapping angioplasty, there is a scattered mild amount of residual chronic adherent thrombus and areas of mild luminal narrowing but no significant residual occlusive thrombus or venous stenosis. Upon injection, there is restoration of the iliofemoral venous outflow channel into  the IVC. No residual flow limiting abnormality. Access removed. Hemostasis obtained with compression. Patient tolerated the procedure well. FINDINGS: Mild overnight improvement in iliofemoral occlusive thrombus. TNK thrombo lysis had to be  stopped because of a very low fibrinogen measuring 60. mechanical thrombectomy and serial balloon angioplasty performed throughout the iliac and femoral veins to restore flow as detailed above. IMPRESSION: Successful fluoroscopic left lower extremity DVT mechanical thrombectomy and serial venous balloon angioplasty to restore antegrade flow within the iliac and femoral veins. Electronically Signed   By: Judie Petit.  Shick M.D.   On: 06/02/2015 13:25   Ir US Guide Vasc Access Left  06/01/2015  INDICATION: Acute symptomatic left femoral popliteal DVT, not responding to Xarelto. EXAM: IR INFUSION THROMBOL VENOUS INITIAL (MS); LEFT EXTREMITY VENOGRAPHY; IR ULTRASOUND GUIDANCE VASC ACCESS LEFT; INFERIOR VENA CAVOGRAM COMPARISON:  None. MEDICATIONS: 1% lidocaine locally, sedation minutes below. ANESTHESIA/SEDATION: Versed 2 mg IV; Fentanyl 75 mcg IV Moderate Sedation Time:  28 The patient was continuously monitored during the procedure by the interventional radiology nurse under my direct supervision. FLUOROSCOPY TIME:  Fluoroscopy Time: 5 minutes 12 seconds (61 mGy). COMPLICATIONS: None immediate. TECHNIQUE: Informed written consent was obtained from the patient after a thorough discussion of the procedural risks, benefits and alternatives. All questions were addressed. Maximal Sterile Barrier Technique was utilized including caps, mask, sterile gowns, sterile gloves, sterile drape, hand hygiene and skin antiseptic. A timeout was performed prior to the initiation of the procedure. Under sterile conditions and local anesthesia, ultrasound percutaneous access performed of the thrombosed left popliteal vein below the knee. Guidewire advanced under fluoroscopy. Micro dilator inserted. Contrast  injection confirms occlusive left femoral popliteal DVT. Six French sheath inserted over a Bentson guidewire. Vert catheter and Glidewire utilized to manipulate the access through the left femoral thrombotic occlusion. Contrast injections along the way also demonstrate thrombotic occlusion of the iliac veins on the left. Catheter was advanced into the IVC. Contrast injection performed of the lower IVC. Nonocclusive thrombus also present in the lower IVC below the renal veins. Exchange Bentson guidewire inserted into the IVC. A 135 cm infusion catheter with a 50 cm infusion length was advanced. Tip position in the lower IVC. Distal infusion marker within the peripheral femoral vein. Position confirmed with fluoroscopy. Images obtained for documentation. FINDINGS: Left lower extremity and IVC venograms confirm acute occlusive left iliac, femoral and popliteal DVT. There is propagation of clot into the lower IVC which is nonocclusive. Successful insertion of an infusion catheter with a 50 cm infusion length to begin catheter thrombo lysis, both through the access sheath and the infusion catheter. IMPRESSION: Successful insertion of a left lower extremity infusion catheter for treatment of the acute occlusive left iliac, femoral and popliteal DVT. There is also nonocclusive clot propagation into the lower IVC. PLAN: TNK thrombo lysis will be initiated at 1 milligram/hour through the infusion catheter and 0.5 milligram/hour through the access sheath. Follow-up venogram perform tomorrow. Electronically Signed   By: Judie Petit.  Shick M.D.   On: 06/01/2015 12:53   Ir Pta Venous Except Dialysis Circuit  06/02/2015  INDICATION: Acute symptomatic left ileal femoral and popliteal DVT, 20 hours status post thrombo lysis, subsequent encounter. EXAM: IR THROMB F/U EVAL ART/VEN FINAL DAY; THROMBECTOMY MECHANICAL ; RADIOLOGY EXAMINATION COMPARISON:  06/01/2015 MEDICATIONS: 1% lidocaine locally ANESTHESIA/SEDATION: Versed 4 mg IV;  Fentanyl 200 mcg IV, 4 mg morphine Moderate Sedation Time:  90 The patient was continuously monitored during the procedure by the interventional radiology nurse under my direct supervision. FLUOROSCOPY TIME:  Fluoroscopy Time: 24 minutes 42 seconds (35 mGy). COMPLICATIONS: None immediate. TECHNIQUE: Informed written consent was obtained from the patient after a thorough discussion of the procedural risks, benefits and alternatives.  All questions were addressed. Maximal Sterile Barrier Technique was utilized including caps, mask, sterile gowns, sterile gloves, sterile drape, hand hygiene and skin antiseptic. A timeout was performed prior to the initiation of the procedure. Under sterile conditions and local anesthesia, left lower extremity follow-up venogram performed through the existing popliteal sheath and the infusion catheter. Follow-up left lower extremity venogram: Following thrombo lysis, there is improvement in the left iliac, femoral popliteal occlusive DVT. Residual thrombus with stagnant flow is present from the iliac bifurcation to the knee. Areas of iliac and femoral venous narrowing noted as well. Mechanical thrombectomy: Over an Amplatz guidewire, the infusion catheter was removed. Sheath was exchanged for an 8 [place] sheath. Through the access, the Indigo CAT 8 suction thrombectomy catheter was passed several times throughout the iliac and femoral residual thrombus. A moderate volume of thrombus was able to be removed via the suction mechanical thrombectomy device. Approximately 350 cc blood volume aspirated during the mechanical thrombectomy. Follow-up venogram demonstrated continued improvement in the thrombus burden throughout the iliac and femoral segments. Scattered areas of adherent clot remain with partial luminal narrowing and persistent venous stasis. Venous angioplasty: For additional chronic adherent clot maceration and treatment of areas of residual venous stenosis, overlapping serial  angioplasty was performed throughout the iliac and femoral veins initially with a 10 mm balloon. There are areas of persistent luminal narrowing. Additional overlapping angioplasty performed with a 12 mm and a 14 mm balloon. Following extensive serial overlapping angioplasty, final venograms are performed. Final venogram: following overnight thrombo lysis, mechanical thrombectomy, and serial 10 -14 mm overlapping angioplasty, there is a scattered mild amount of residual chronic adherent thrombus and areas of mild luminal narrowing but no significant residual occlusive thrombus or venous stenosis. Upon injection, there is restoration of the iliofemoral venous outflow channel into the IVC. No residual flow limiting abnormality. Access removed. Hemostasis obtained with compression. Patient tolerated the procedure well. FINDINGS: Mild overnight improvement in iliofemoral occlusive thrombus. TNK thrombo lysis had to be stopped because of a very low fibrinogen measuring 60. mechanical thrombectomy and serial balloon angioplasty performed throughout the iliac and femoral veins to restore flow as detailed above. IMPRESSION: Successful fluoroscopic left lower extremity DVT mechanical thrombectomy and serial venous balloon angioplasty to restore antegrade flow within the iliac and femoral veins. Electronically Signed   By: Judie Petit.  Shick M.D.   On: 06/02/2015 13:25   Ir Infusion Thrombol Venous Initial (ms)  06/01/2015  INDICATION: Acute symptomatic left femoral popliteal DVT, not responding to Xarelto. EXAM: IR INFUSION THROMBOL VENOUS INITIAL (MS); LEFT EXTREMITY VENOGRAPHY; IR ULTRASOUND GUIDANCE VASC ACCESS LEFT; INFERIOR VENA CAVOGRAM COMPARISON:  None. MEDICATIONS: 1% lidocaine locally, sedation minutes below. ANESTHESIA/SEDATION: Versed 2 mg IV; Fentanyl 75 mcg IV Moderate Sedation Time:  28 The patient was continuously monitored during the procedure by the interventional radiology nurse under my direct supervision.  FLUOROSCOPY TIME:  Fluoroscopy Time: 5 minutes 12 seconds (61 mGy). COMPLICATIONS: None immediate. TECHNIQUE: Informed written consent was obtained from the patient after a thorough discussion of the procedural risks, benefits and alternatives. All questions were addressed. Maximal Sterile Barrier Technique was utilized including caps, mask, sterile gowns, sterile gloves, sterile drape, hand hygiene and skin antiseptic. A timeout was performed prior to the initiation of the procedure. Under sterile conditions and local anesthesia, ultrasound percutaneous access performed of the thrombosed left popliteal vein below the knee. Guidewire advanced under fluoroscopy. Micro dilator inserted. Contrast injection confirms occlusive left femoral popliteal DVT. Six French sheath inserted over a  Bentson guidewire. Vert catheter and Glidewire utilized to manipulate the access through the left femoral thrombotic occlusion. Contrast injections along the way also demonstrate thrombotic occlusion of the iliac veins on the left. Catheter was advanced into the IVC. Contrast injection performed of the lower IVC. Nonocclusive thrombus also present in the lower IVC below the renal veins. Exchange Bentson guidewire inserted into the IVC. A 135 cm infusion catheter with a 50 cm infusion length was advanced. Tip position in the lower IVC. Distal infusion marker within the peripheral femoral vein. Position confirmed with fluoroscopy. Images obtained for documentation. FINDINGS: Left lower extremity and IVC venograms confirm acute occlusive left iliac, femoral and popliteal DVT. There is propagation of clot into the lower IVC which is nonocclusive. Successful insertion of an infusion catheter with a 50 cm infusion length to begin catheter thrombo lysis, both through the access sheath and the infusion catheter. IMPRESSION: Successful insertion of a left lower extremity infusion catheter for treatment of the acute occlusive left iliac, femoral  and popliteal DVT. There is also nonocclusive clot propagation into the lower IVC. PLAN: TNK thrombo lysis will be initiated at 1 milligram/hour through the infusion catheter and 0.5 milligram/hour through the access sheath. Follow-up venogram perform tomorrow. Electronically Signed   By: Judie Petit.  Shick M.D.   On: 06/01/2015 12:53   Ir Rande Lawman F/u Eval Art/ven Final Day (ms)  06/02/2015  INDICATION: Acute symptomatic left ileal femoral and popliteal DVT, 20 hours status post thrombo lysis, subsequent encounter. EXAM: IR THROMB F/U EVAL ART/VEN FINAL DAY; THROMBECTOMY MECHANICAL ; RADIOLOGY EXAMINATION COMPARISON:  06/01/2015 MEDICATIONS: 1% lidocaine locally ANESTHESIA/SEDATION: Versed 4 mg IV; Fentanyl 200 mcg IV, 4 mg morphine Moderate Sedation Time:  90 The patient was continuously monitored during the procedure by the interventional radiology nurse under my direct supervision. FLUOROSCOPY TIME:  Fluoroscopy Time: 24 minutes 42 seconds (35 mGy). COMPLICATIONS: None immediate. TECHNIQUE: Informed written consent was obtained from the patient after a thorough discussion of the procedural risks, benefits and alternatives. All questions were addressed. Maximal Sterile Barrier Technique was utilized including caps, mask, sterile gowns, sterile gloves, sterile drape, hand hygiene and skin antiseptic. A timeout was performed prior to the initiation of the procedure. Under sterile conditions and local anesthesia, left lower extremity follow-up venogram performed through the existing popliteal sheath and the infusion catheter. Follow-up left lower extremity venogram: Following thrombo lysis, there is improvement in the left iliac, femoral popliteal occlusive DVT. Residual thrombus with stagnant flow is present from the iliac bifurcation to the knee. Areas of iliac and femoral venous narrowing noted as well. Mechanical thrombectomy: Over an Amplatz guidewire, the infusion catheter was removed. Sheath was exchanged for an 8  [place] sheath. Through the access, the Indigo CAT 8 suction thrombectomy catheter was passed several times throughout the iliac and femoral residual thrombus. A moderate volume of thrombus was able to be removed via the suction mechanical thrombectomy device. Approximately 350 cc blood volume aspirated during the mechanical thrombectomy. Follow-up venogram demonstrated continued improvement in the thrombus burden throughout the iliac and femoral segments. Scattered areas of adherent clot remain with partial luminal narrowing and persistent venous stasis. Venous angioplasty: For additional chronic adherent clot maceration and treatment of areas of residual venous stenosis, overlapping serial angioplasty was performed throughout the iliac and femoral veins initially with a 10 mm balloon. There are areas of persistent luminal narrowing. Additional overlapping angioplasty performed with a 12 mm and a 14 mm balloon. Following extensive serial overlapping angioplasty, final venograms are  performed. Final venogram: following overnight thrombo lysis, mechanical thrombectomy, and serial 10 -14 mm overlapping angioplasty, there is a scattered mild amount of residual chronic adherent thrombus and areas of mild luminal narrowing but no significant residual occlusive thrombus or venous stenosis. Upon injection, there is restoration of the iliofemoral venous outflow channel into the IVC. No residual flow limiting abnormality. Access removed. Hemostasis obtained with compression. Patient tolerated the procedure well. FINDINGS: Mild overnight improvement in iliofemoral occlusive thrombus. TNK thrombo lysis had to be stopped because of a very low fibrinogen measuring 60. mechanical thrombectomy and serial balloon angioplasty performed throughout the iliac and femoral veins to restore flow as detailed above. IMPRESSION: Successful fluoroscopic left lower extremity DVT mechanical thrombectomy and serial venous balloon angioplasty to  restore antegrade flow within the iliac and femoral veins. Electronically Signed   By: Judie Petit.  Shick M.D.   On: 06/02/2015 13:25    Labs:  CBC:  Recent Labs  05/31/15 1935 06/02/15 0438 06/03/15 0735  WBC 9.2 12.9* 10.8*  HGB 11.0* 11.3* 10.1*  HCT 32.9* 33.7* 28.3*  PLT 372 244 227    COAGS:  Recent Labs  05/31/15 1935  06/01/15 2045 06/02/15 0438 06/02/15 0935 06/03/15 0735  INR 1.28  --   --   --   --   --   APTT 38*  < > 136* 77* 67* 54*  < > = values in this interval not displayed.  BMP:  Recent Labs  05/31/15 1935 06/02/15 0438  NA 137 135  K 3.9 4.0  CL 98* 101  CO2 28 25  GLUCOSE 116* 110*  BUN 15 14  CALCIUM 9.0 8.6*  CREATININE 0.86 0.86  GFRNONAA >60 >60  GFRAA >60 >60    LIVER FUNCTION TESTS:  Recent Labs  05/31/15 1935  BILITOT 0.1*  AST 103*  ALT 113*  ALKPHOS 245*  PROT 6.9  ALBUMIN 2.5*    Assessment and Plan: LLE DVT S/p TPA assisted lysis with PTA Continue compression hose OK to get OOB and ambulate, may need PT eval Cont IV hep gtt until switched to po anticoagulation of choice.  Electronically Signed: Brayton El 06/03/2015, 11:05 AM   I spent a total of 15 Minutes at the the patient's bedside AND on the patient's hospital floor or unit, greater than 50% of which was counseling/coordinating care for LLE DVT lysis

## 2015-06-03 NOTE — Progress Notes (Addendum)
  Progress Note    06/03/2015 11:38 AM * No surgery found *  Subjective:  Feeling much better  Afebrile x 24 hrs HR 80's-100's NSR 100's-120's systolic 97% RA  Filed Vitals:   06/03/15 1100 06/03/15 1137  BP: 117/69   Pulse: 93   Temp:  98.4 F (36.9 C)  Resp: 12     Physical Exam: Cardiac:  regular Lungs:  Non labored Extremities:  +palpable left DP/PT pulses; still with edema LLE  CBC    Component Value Date/Time   WBC 10.8* 06/03/2015 0735   RBC 3.46* 06/03/2015 0735   HGB 10.1* 06/03/2015 0735   HCT 28.3* 06/03/2015 0735   PLT 227 06/03/2015 0735   MCV 81.8 06/03/2015 0735   MCH 29.2 06/03/2015 0735   MCHC 35.7 06/03/2015 0735   RDW 12.9 06/03/2015 0735    BMET    Component Value Date/Time   NA 135 06/02/2015 0438   K 4.0 06/02/2015 0438   CL 101 06/02/2015 0438   CO2 25 06/02/2015 0438   GLUCOSE 110* 06/02/2015 0438   BUN 14 06/02/2015 0438   CREATININE 0.86 06/02/2015 0438   CALCIUM 8.6* 06/02/2015 0438   GFRNONAA >60 06/02/2015 0438   GFRAA >60 06/02/2015 0438    INR    Component Value Date/Time   INR 1.28 05/31/2015 1935     Intake/Output Summary (Last 24 hours) at 06/03/15 1138 Last data filed at 06/03/15 1000  Gross per 24 hour  Intake    583 ml  Output   1000 ml  Net   -417 ml     Assessment:  65 y.o. male is s/p:  LLE DVT TPA assisted lysis with PTA by IR  Plan: -pt much more comfortable this morning as compared to when he was seen in the office Thursday -near complete resolution of iliofemoral DVT, with a restored channel of venous outflow -slight drop in hgb from 11.3 to 10.1-will check CBC tomorrow morning -DVT prophylaxis:  Heparin gtt until converted to oral agent -continue TED hose -will transfer to telemetry floor -will get PT consult for mobilization   Doreatha Massed, PA-C [facility] [phone] 06/03/2015 11:38 AM    I have examined the patient, reviewed and agree with above. Reviewed  venogram yesterday and discussed with the patient. I good clearing of extensive thrombus in left leg. Continues to have marked swelling in his left leg but pain is somewhat diminished. Will continue with anticoagulation mobilization. Will transfer to 2 west telemetry  Jaleel Allen, Tawanna Cooler, MD 06/03/2015 1:14 PM

## 2015-06-04 ENCOUNTER — Encounter (HOSPITAL_COMMUNITY): Payer: Self-pay | Admitting: *Deleted

## 2015-06-04 ENCOUNTER — Telehealth: Payer: Self-pay | Admitting: Vascular Surgery

## 2015-06-04 LAB — HEPARIN LEVEL (UNFRACTIONATED)
HEPARIN UNFRACTIONATED: 0.34 [IU]/mL (ref 0.30–0.70)
Heparin Unfractionated: 0.35 IU/mL (ref 0.30–0.70)

## 2015-06-04 MED ORDER — RIVAROXABAN 15 MG PO TABS: 15.0000 mg | ORAL_TABLET | ORAL | Status: DC

## 2015-06-04 MED ORDER — HYDROCODONE-ACETAMINOPHEN 5-325 MG PO TABS
1.0000 | ORAL_TABLET | Freq: Once | ORAL | Status: AC
  Administered 2015-06-04: 1 via ORAL

## 2015-06-04 MED ORDER — RIVAROXABAN 15 MG PO TABS: 15.0000 mg | ORAL_TABLET | Freq: Two times a day (BID) | ORAL | Status: DC

## 2015-06-04 NOTE — Progress Notes (Signed)
ANTICOAGULATION CONSULT NOTE - Follow Up Consult  Pharmacy Consult for heparin Indication: DVT  Labs:  Recent Labs  06/02/15 0438 06/02/15 0935 06/03/15 0735 06/03/15 1600 06/04/15 0059  HGB 11.3*  --  10.1*  --   --   HCT 33.7*  --  28.3*  --   --   PLT 244  --  227  --   --   APTT 77* 67* 54* 45*  --   HEPARINUNFRC  --   --  0.19* 0.21* 0.35  CREATININE 0.86  --   --   --   --      Assessment/Plan:  65yo male therapeutic on heparin after rate increase. Will continue gtt at current rate and confirm stable with additional level.   Vernard Gambles, PharmD, BCPS  06/04/2015,1:42 AM

## 2015-06-04 NOTE — Progress Notes (Signed)
Patient ID: Barry Rice, male   DOB: 1950-09-25, 65 y.o.   MRN: [id number]    Referring Physician(s): Dr. Fabienne Bruns  Chief Complaint: (L) LE DVT  Subjective: Feeling well, some pain when he puts his leg down to ambulate, but rest pain is gone.  Allergies: Review of patient's allergies indicates no known allergies.  Medications: Prior to Admission medications   Medication Sig Start Date End Date Taking? Authorizing Provider  Hydrocodone-Acetaminophen 7.5-300 MG TABS Take 1 tablet by mouth every 6 (six) hours.  05/25/15  Yes Historical Provider, MD  XARELTO 20 MG TABS tablet Take 15 mg by mouth 2 (two) times daily.  05/28/15  Yes Historical Provider, MD  PARoxetine (PAXIL) 20 MG tablet Take 10 mg by mouth daily. Reported on 05/31/2015 03/22/15   Historical Provider, MD  rOPINIRole (REQUIP) 0.5 MG tablet Take 0.5 mg by mouth at bedtime. Reported on 05/31/2015 05/04/15   Historical Provider, MD    Vital Signs: BP 127/63 mmHg  Pulse 85  Temp(Src) 98.4 F (36.9 C) (Oral)  Resp 16  Ht  (1.854 m)  Wt 184 lb 4.9 oz (83.6 kg)  BMI 24.32 kg/m2  SpO2 95%  Physical Exam: Ext: left lower extremity still with 2+ edema.  Compression hose in place.  Angio puncture site examined and is healing well.  Site is c/d/i.  Good pedal pulse  Imaging: Ir Veno/ext/uni Left  06/01/2015  INDICATION: Acute symptomatic left femoral popliteal DVT, not responding to Xarelto. EXAM: IR INFUSION THROMBOL VENOUS INITIAL (MS); LEFT EXTREMITY VENOGRAPHY; IR ULTRASOUND GUIDANCE VASC ACCESS LEFT; INFERIOR VENA CAVOGRAM COMPARISON:  None. MEDICATIONS: 1% lidocaine locally, sedation minutes below. ANESTHESIA/SEDATION: Versed 2 mg IV; Fentanyl 75 mcg IV Moderate Sedation Time:  28 The patient was continuously monitored during the procedure by the interventional radiology nurse under my direct supervision. FLUOROSCOPY TIME:  Fluoroscopy Time: 5 minutes 12 seconds (61 mGy). COMPLICATIONS: None immediate. TECHNIQUE:  Informed written consent was obtained from the patient after a thorough discussion of the procedural risks, benefits and alternatives. All questions were addressed. Maximal Sterile Barrier Technique was utilized including caps, mask, sterile gowns, sterile gloves, sterile drape, hand hygiene and skin antiseptic. A timeout was performed prior to the initiation of the procedure. Under sterile conditions and local anesthesia, ultrasound percutaneous access performed of the thrombosed left popliteal vein below the knee. Guidewire advanced under fluoroscopy. Micro dilator inserted. Contrast injection confirms occlusive left femoral popliteal DVT. Six French sheath inserted over a Bentson guidewire. Vert catheter and Glidewire utilized to manipulate the access through the left femoral thrombotic occlusion. Contrast injections along the way also demonstrate thrombotic occlusion of the iliac veins on the left. Catheter was advanced into the IVC. Contrast injection performed of the lower IVC. Nonocclusive thrombus also present in the lower IVC below the renal veins. Exchange Bentson guidewire inserted into the IVC. A 135 cm infusion catheter with a 50 cm infusion length was advanced. Tip position in the lower IVC. Distal infusion marker within the peripheral femoral vein. Position confirmed with fluoroscopy. Images obtained for documentation. FINDINGS: Left lower extremity and IVC venograms confirm acute occlusive left iliac, femoral and popliteal DVT. There is propagation of clot into the lower IVC which is nonocclusive. Successful insertion of an infusion catheter with a 50 cm infusion length to begin catheter thrombo lysis, both through the access sheath and the infusion catheter. IMPRESSION: Successful insertion of a left lower extremity infusion catheter for treatment of the acute occlusive left iliac, femoral and popliteal  DVT. There is also nonocclusive clot propagation into the lower IVC. PLAN: TNK thrombo lysis will  be initiated at 1 milligram/hour through the infusion catheter and 0.5 milligram/hour through the access sheath. Follow-up venogram perform tomorrow. Electronically Signed   By: Judie Petit.  Shick M.D.   On: 06/01/2015 12:53   Ir Caffie Damme Ivc  06/01/2015  INDICATION: Acute symptomatic left femoral popliteal DVT, not responding to Xarelto. EXAM: IR INFUSION THROMBOL VENOUS INITIAL (MS); LEFT EXTREMITY VENOGRAPHY; IR ULTRASOUND GUIDANCE VASC ACCESS LEFT; INFERIOR VENA CAVOGRAM COMPARISON:  None. MEDICATIONS: 1% lidocaine locally, sedation minutes below. ANESTHESIA/SEDATION: Versed 2 mg IV; Fentanyl 75 mcg IV Moderate Sedation Time:  28 The patient was continuously monitored during the procedure by the interventional radiology nurse under my direct supervision. FLUOROSCOPY TIME:  Fluoroscopy Time: 5 minutes 12 seconds (61 mGy). COMPLICATIONS: None immediate. TECHNIQUE: Informed written consent was obtained from the patient after a thorough discussion of the procedural risks, benefits and alternatives. All questions were addressed. Maximal Sterile Barrier Technique was utilized including caps, mask, sterile gowns, sterile gloves, sterile drape, hand hygiene and skin antiseptic. A timeout was performed prior to the initiation of the procedure. Under sterile conditions and local anesthesia, ultrasound percutaneous access performed of the thrombosed left popliteal vein below the knee. Guidewire advanced under fluoroscopy. Micro dilator inserted. Contrast injection confirms occlusive left femoral popliteal DVT. Six French sheath inserted over a Bentson guidewire. Vert catheter and Glidewire utilized to manipulate the access through the left femoral thrombotic occlusion. Contrast injections along the way also demonstrate thrombotic occlusion of the iliac veins on the left. Catheter was advanced into the IVC. Contrast injection performed of the lower IVC. Nonocclusive thrombus also present in the lower IVC below the renal  veins. Exchange Bentson guidewire inserted into the IVC. A 135 cm infusion catheter with a 50 cm infusion length was advanced. Tip position in the lower IVC. Distal infusion marker within the peripheral femoral vein. Position confirmed with fluoroscopy. Images obtained for documentation. FINDINGS: Left lower extremity and IVC venograms confirm acute occlusive left iliac, femoral and popliteal DVT. There is propagation of clot into the lower IVC which is nonocclusive. Successful insertion of an infusion catheter with a 50 cm infusion length to begin catheter thrombo lysis, both through the access sheath and the infusion catheter. IMPRESSION: Successful insertion of a left lower extremity infusion catheter for treatment of the acute occlusive left iliac, femoral and popliteal DVT. There is also nonocclusive clot propagation into the lower IVC. PLAN: TNK thrombo lysis will be initiated at 1 milligram/hour through the infusion catheter and 0.5 milligram/hour through the access sheath. Follow-up venogram perform tomorrow. Electronically Signed   By: Judie Petit.  Shick M.D.   On: 06/01/2015 12:53   Ir Thrombect Prim Mech Add (inclu) Mod Sed  06/02/2015  INDICATION: Acute symptomatic left ileal femoral and popliteal DVT, 20 hours status post thrombo lysis, subsequent encounter. EXAM: IR THROMB F/U EVAL ART/VEN FINAL DAY; THROMBECTOMY MECHANICAL ; RADIOLOGY EXAMINATION COMPARISON:  06/01/2015 MEDICATIONS: 1% lidocaine locally ANESTHESIA/SEDATION: Versed 4 mg IV; Fentanyl 200 mcg IV, 4 mg morphine Moderate Sedation Time:  90 The patient was continuously monitored during the procedure by the interventional radiology nurse under my direct supervision. FLUOROSCOPY TIME:  Fluoroscopy Time: 24 minutes 42 seconds (35 mGy). COMPLICATIONS: None immediate. TECHNIQUE: Informed written consent was obtained from the patient after a thorough discussion of the procedural risks, benefits and alternatives. All questions were addressed. Maximal  Sterile Barrier Technique was utilized including caps, mask, sterile gowns, sterile  gloves, sterile drape, hand hygiene and skin antiseptic. A timeout was performed prior to the initiation of the procedure. Under sterile conditions and local anesthesia, left lower extremity follow-up venogram performed through the existing popliteal sheath and the infusion catheter. Follow-up left lower extremity venogram: Following thrombo lysis, there is improvement in the left iliac, femoral popliteal occlusive DVT. Residual thrombus with stagnant flow is present from the iliac bifurcation to the knee. Areas of iliac and femoral venous narrowing noted as well. Mechanical thrombectomy: Over an Amplatz guidewire, the infusion catheter was removed. Sheath was exchanged for an 8 [place] sheath. Through the access, the Indigo CAT 8 suction thrombectomy catheter was passed several times throughout the iliac and femoral residual thrombus. A moderate volume of thrombus was able to be removed via the suction mechanical thrombectomy device. Approximately 350 cc blood volume aspirated during the mechanical thrombectomy. Follow-up venogram demonstrated continued improvement in the thrombus burden throughout the iliac and femoral segments. Scattered areas of adherent clot remain with partial luminal narrowing and persistent venous stasis. Venous angioplasty: For additional chronic adherent clot maceration and treatment of areas of residual venous stenosis, overlapping serial angioplasty was performed throughout the iliac and femoral veins initially with a 10 mm balloon. There are areas of persistent luminal narrowing. Additional overlapping angioplasty performed with a 12 mm and a 14 mm balloon. Following extensive serial overlapping angioplasty, final venograms are performed. Final venogram: following overnight thrombo lysis, mechanical thrombectomy, and serial 10 -14 mm overlapping angioplasty, there is a scattered mild amount of residual  chronic adherent thrombus and areas of mild luminal narrowing but no significant residual occlusive thrombus or venous stenosis. Upon injection, there is restoration of the iliofemoral venous outflow channel into the IVC. No residual flow limiting abnormality. Access removed. Hemostasis obtained with compression. Patient tolerated the procedure well. FINDINGS: Mild overnight improvement in iliofemoral occlusive thrombus. TNK thrombo lysis had to be stopped because of a very low fibrinogen measuring 60. mechanical thrombectomy and serial balloon angioplasty performed throughout the iliac and femoral veins to restore flow as detailed above. IMPRESSION: Successful fluoroscopic left lower extremity DVT mechanical thrombectomy and serial venous balloon angioplasty to restore antegrade flow within the iliac and femoral veins. Electronically Signed   By: Judie Petit.  Shick M.D.   On: 06/02/2015 13:25   Ir US Guide Vasc Access Left  06/01/2015  INDICATION: Acute symptomatic left femoral popliteal DVT, not responding to Xarelto. EXAM: IR INFUSION THROMBOL VENOUS INITIAL (MS); LEFT EXTREMITY VENOGRAPHY; IR ULTRASOUND GUIDANCE VASC ACCESS LEFT; INFERIOR VENA CAVOGRAM COMPARISON:  None. MEDICATIONS: 1% lidocaine locally, sedation minutes below. ANESTHESIA/SEDATION: Versed 2 mg IV; Fentanyl 75 mcg IV Moderate Sedation Time:  28 The patient was continuously monitored during the procedure by the interventional radiology nurse under my direct supervision. FLUOROSCOPY TIME:  Fluoroscopy Time: 5 minutes 12 seconds (61 mGy). COMPLICATIONS: None immediate. TECHNIQUE: Informed written consent was obtained from the patient after a thorough discussion of the procedural risks, benefits and alternatives. All questions were addressed. Maximal Sterile Barrier Technique was utilized including caps, mask, sterile gowns, sterile gloves, sterile drape, hand hygiene and skin antiseptic. A timeout was performed prior to the initiation of the procedure.  Under sterile conditions and local anesthesia, ultrasound percutaneous access performed of the thrombosed left popliteal vein below the knee. Guidewire advanced under fluoroscopy. Micro dilator inserted. Contrast injection confirms occlusive left femoral popliteal DVT. Six French sheath inserted over a Bentson guidewire. Vert catheter and Glidewire utilized to manipulate the access through the left femoral thrombotic  occlusion. Contrast injections along the way also demonstrate thrombotic occlusion of the iliac veins on the left. Catheter was advanced into the IVC. Contrast injection performed of the lower IVC. Nonocclusive thrombus also present in the lower IVC below the renal veins. Exchange Bentson guidewire inserted into the IVC. A 135 cm infusion catheter with a 50 cm infusion length was advanced. Tip position in the lower IVC. Distal infusion marker within the peripheral femoral vein. Position confirmed with fluoroscopy. Images obtained for documentation. FINDINGS: Left lower extremity and IVC venograms confirm acute occlusive left iliac, femoral and popliteal DVT. There is propagation of clot into the lower IVC which is nonocclusive. Successful insertion of an infusion catheter with a 50 cm infusion length to begin catheter thrombo lysis, both through the access sheath and the infusion catheter. IMPRESSION: Successful insertion of a left lower extremity infusion catheter for treatment of the acute occlusive left iliac, femoral and popliteal DVT. There is also nonocclusive clot propagation into the lower IVC. PLAN: TNK thrombo lysis will be initiated at 1 milligram/hour through the infusion catheter and 0.5 milligram/hour through the access sheath. Follow-up venogram perform tomorrow. Electronically Signed   By: Judie Petit.  Shick M.D.   On: 06/01/2015 12:53   Ir Pta Venous Except Dialysis Circuit  06/02/2015  INDICATION: Acute symptomatic left ileal femoral and popliteal DVT, 20 hours status post thrombo lysis,  subsequent encounter. EXAM: IR THROMB F/U EVAL ART/VEN FINAL DAY; THROMBECTOMY MECHANICAL ; RADIOLOGY EXAMINATION COMPARISON:  06/01/2015 MEDICATIONS: 1% lidocaine locally ANESTHESIA/SEDATION: Versed 4 mg IV; Fentanyl 200 mcg IV, 4 mg morphine Moderate Sedation Time:  90 The patient was continuously monitored during the procedure by the interventional radiology nurse under my direct supervision. FLUOROSCOPY TIME:  Fluoroscopy Time: 24 minutes 42 seconds (35 mGy). COMPLICATIONS: None immediate. TECHNIQUE: Informed written consent was obtained from the patient after a thorough discussion of the procedural risks, benefits and alternatives. All questions were addressed. Maximal Sterile Barrier Technique was utilized including caps, mask, sterile gowns, sterile gloves, sterile drape, hand hygiene and skin antiseptic. A timeout was performed prior to the initiation of the procedure. Under sterile conditions and local anesthesia, left lower extremity follow-up venogram performed through the existing popliteal sheath and the infusion catheter. Follow-up left lower extremity venogram: Following thrombo lysis, there is improvement in the left iliac, femoral popliteal occlusive DVT. Residual thrombus with stagnant flow is present from the iliac bifurcation to the knee. Areas of iliac and femoral venous narrowing noted as well. Mechanical thrombectomy: Over an Amplatz guidewire, the infusion catheter was removed. Sheath was exchanged for an 8 [place] sheath. Through the access, the Indigo CAT 8 suction thrombectomy catheter was passed several times throughout the iliac and femoral residual thrombus. A moderate volume of thrombus was able to be removed via the suction mechanical thrombectomy device. Approximately 350 cc blood volume aspirated during the mechanical thrombectomy. Follow-up venogram demonstrated continued improvement in the thrombus burden throughout the iliac and femoral segments. Scattered areas of adherent clot  remain with partial luminal narrowing and persistent venous stasis. Venous angioplasty: For additional chronic adherent clot maceration and treatment of areas of residual venous stenosis, overlapping serial angioplasty was performed throughout the iliac and femoral veins initially with a 10 mm balloon. There are areas of persistent luminal narrowing. Additional overlapping angioplasty performed with a 12 mm and a 14 mm balloon. Following extensive serial overlapping angioplasty, final venograms are performed. Final venogram: following overnight thrombo lysis, mechanical thrombectomy, and serial 10 -14 mm overlapping angioplasty, there is  a scattered mild amount of residual chronic adherent thrombus and areas of mild luminal narrowing but no significant residual occlusive thrombus or venous stenosis. Upon injection, there is restoration of the iliofemoral venous outflow channel into the IVC. No residual flow limiting abnormality. Access removed. Hemostasis obtained with compression. Patient tolerated the procedure well. FINDINGS: Mild overnight improvement in iliofemoral occlusive thrombus. TNK thrombo lysis had to be stopped because of a very low fibrinogen measuring 60. mechanical thrombectomy and serial balloon angioplasty performed throughout the iliac and femoral veins to restore flow as detailed above. IMPRESSION: Successful fluoroscopic left lower extremity DVT mechanical thrombectomy and serial venous balloon angioplasty to restore antegrade flow within the iliac and femoral veins. Electronically Signed   By: Judie Petit.  Shick M.D.   On: 06/02/2015 13:25   Ir Infusion Thrombol Venous Initial (ms)  06/01/2015  INDICATION: Acute symptomatic left femoral popliteal DVT, not responding to Xarelto. EXAM: IR INFUSION THROMBOL VENOUS INITIAL (MS); LEFT EXTREMITY VENOGRAPHY; IR ULTRASOUND GUIDANCE VASC ACCESS LEFT; INFERIOR VENA CAVOGRAM COMPARISON:  None. MEDICATIONS: 1% lidocaine locally, sedation minutes below.  ANESTHESIA/SEDATION: Versed 2 mg IV; Fentanyl 75 mcg IV Moderate Sedation Time:  28 The patient was continuously monitored during the procedure by the interventional radiology nurse under my direct supervision. FLUOROSCOPY TIME:  Fluoroscopy Time: 5 minutes 12 seconds (61 mGy). COMPLICATIONS: None immediate. TECHNIQUE: Informed written consent was obtained from the patient after a thorough discussion of the procedural risks, benefits and alternatives. All questions were addressed. Maximal Sterile Barrier Technique was utilized including caps, mask, sterile gowns, sterile gloves, sterile drape, hand hygiene and skin antiseptic. A timeout was performed prior to the initiation of the procedure. Under sterile conditions and local anesthesia, ultrasound percutaneous access performed of the thrombosed left popliteal vein below the knee. Guidewire advanced under fluoroscopy. Micro dilator inserted. Contrast injection confirms occlusive left femoral popliteal DVT. Six French sheath inserted over a Bentson guidewire. Vert catheter and Glidewire utilized to manipulate the access through the left femoral thrombotic occlusion. Contrast injections along the way also demonstrate thrombotic occlusion of the iliac veins on the left. Catheter was advanced into the IVC. Contrast injection performed of the lower IVC. Nonocclusive thrombus also present in the lower IVC below the renal veins. Exchange Bentson guidewire inserted into the IVC. A 135 cm infusion catheter with a 50 cm infusion length was advanced. Tip position in the lower IVC. Distal infusion marker within the peripheral femoral vein. Position confirmed with fluoroscopy. Images obtained for documentation. FINDINGS: Left lower extremity and IVC venograms confirm acute occlusive left iliac, femoral and popliteal DVT. There is propagation of clot into the lower IVC which is nonocclusive. Successful insertion of an infusion catheter with a 50 cm infusion length to begin  catheter thrombo lysis, both through the access sheath and the infusion catheter. IMPRESSION: Successful insertion of a left lower extremity infusion catheter for treatment of the acute occlusive left iliac, femoral and popliteal DVT. There is also nonocclusive clot propagation into the lower IVC. PLAN: TNK thrombo lysis will be initiated at 1 milligram/hour through the infusion catheter and 0.5 milligram/hour through the access sheath. Follow-up venogram perform tomorrow. Electronically Signed   By: Judie Petit.  Shick M.D.   On: 06/01/2015 12:53   Ir Rande Lawman F/u Eval Art/ven Final Day (ms)  06/02/2015  INDICATION: Acute symptomatic left ileal femoral and popliteal DVT, 20 hours status post thrombo lysis, subsequent encounter. EXAM: IR THROMB F/U EVAL ART/VEN FINAL DAY; THROMBECTOMY MECHANICAL ; RADIOLOGY EXAMINATION COMPARISON:  06/01/2015 MEDICATIONS: 1%  lidocaine locally ANESTHESIA/SEDATION: Versed 4 mg IV; Fentanyl 200 mcg IV, 4 mg morphine Moderate Sedation Time:  90 The patient was continuously monitored during the procedure by the interventional radiology nurse under my direct supervision. FLUOROSCOPY TIME:  Fluoroscopy Time: 24 minutes 42 seconds (35 mGy). COMPLICATIONS: None immediate. TECHNIQUE: Informed written consent was obtained from the patient after a thorough discussion of the procedural risks, benefits and alternatives. All questions were addressed. Maximal Sterile Barrier Technique was utilized including caps, mask, sterile gowns, sterile gloves, sterile drape, hand hygiene and skin antiseptic. A timeout was performed prior to the initiation of the procedure. Under sterile conditions and local anesthesia, left lower extremity follow-up venogram performed through the existing popliteal sheath and the infusion catheter. Follow-up left lower extremity venogram: Following thrombo lysis, there is improvement in the left iliac, femoral popliteal occlusive DVT. Residual thrombus with stagnant flow is present  from the iliac bifurcation to the knee. Areas of iliac and femoral venous narrowing noted as well. Mechanical thrombectomy: Over an Amplatz guidewire, the infusion catheter was removed. Sheath was exchanged for an 8 [place] sheath. Through the access, the Indigo CAT 8 suction thrombectomy catheter was passed several times throughout the iliac and femoral residual thrombus. A moderate volume of thrombus was able to be removed via the suction mechanical thrombectomy device. Approximately 350 cc blood volume aspirated during the mechanical thrombectomy. Follow-up venogram demonstrated continued improvement in the thrombus burden throughout the iliac and femoral segments. Scattered areas of adherent clot remain with partial luminal narrowing and persistent venous stasis. Venous angioplasty: For additional chronic adherent clot maceration and treatment of areas of residual venous stenosis, overlapping serial angioplasty was performed throughout the iliac and femoral veins initially with a 10 mm balloon. There are areas of persistent luminal narrowing. Additional overlapping angioplasty performed with a 12 mm and a 14 mm balloon. Following extensive serial overlapping angioplasty, final venograms are performed. Final venogram: following overnight thrombo lysis, mechanical thrombectomy, and serial 10 -14 mm overlapping angioplasty, there is a scattered mild amount of residual chronic adherent thrombus and areas of mild luminal narrowing but no significant residual occlusive thrombus or venous stenosis. Upon injection, there is restoration of the iliofemoral venous outflow channel into the IVC. No residual flow limiting abnormality. Access removed. Hemostasis obtained with compression. Patient tolerated the procedure well. FINDINGS: Mild overnight improvement in iliofemoral occlusive thrombus. TNK thrombo lysis had to be stopped because of a very low fibrinogen measuring 60. mechanical thrombectomy and serial balloon  angioplasty performed throughout the iliac and femoral veins to restore flow as detailed above. IMPRESSION: Successful fluoroscopic left lower extremity DVT mechanical thrombectomy and serial venous balloon angioplasty to restore antegrade flow within the iliac and femoral veins. Electronically Signed   By: Judie Petit.  Shick M.D.   On: 06/02/2015 13:25    Labs:  CBC:  Recent Labs  05/31/15 1935 06/02/15 0438 06/03/15 0735  WBC 9.2 12.9* 10.8*  HGB 11.0* 11.3* 10.1*  HCT 32.9* 33.7* 28.3*  PLT 372 244 227    COAGS:  Recent Labs  05/31/15 1935  06/02/15 0438 06/02/15 0935 06/03/15 0735 06/03/15 1600  INR 1.28  --   --   --   --   --   APTT 38*  < > 77* 67* 54* 45*  < > = values in this interval not displayed.  BMP:  Recent Labs  05/31/15 1935 06/02/15 0438  NA 137 135  K 3.9 4.0  CL 98* 101  CO2 28 25  GLUCOSE  116* 110*  BUN 15 14  CALCIUM 9.0 8.6*  CREATININE 0.86 0.86  GFRNONAA >60 >60  GFRAA >60 >60    LIVER FUNCTION TESTS:  Recent Labs  05/31/15 1935  BILITOT 0.1*  AST 103*  ALT 113*  ALKPHOS 245*  PROT 6.9  ALBUMIN 2.5*    Assessment and Plan: LLE DVT S/p TPA assisted lysis with PTA -cont compression hose -OOB with PT currently and doing well -likely starting xarelto today -we will have him follow up in clinic with Dr. Miles Costain in 3-4 weeks Electronically Signed: Barnetta Chapel E 06/04/2015, 10:17 AM   I spent a total of 15 Minutes at the the patient's bedside AND on the patient's hospital floor or unit, greater than 50% of which was counseling/coordinating care for (L) LE DVT, s/p lysis and angioplasty

## 2015-06-04 NOTE — Progress Notes (Addendum)
  Progress Note    06/04/2015 7:41 AM LLE DVT TPA assisted lysis with PTA by IR  Subjective:  Required less pain medication last night.  Says that when he puts his leg down, he feels like it gushes down his leg  Afebrile HR 80's NSR 110's-120's systolic 95% RA  Filed Vitals:   06/03/15 2112 06/04/15 0418  BP: 111/50 127/63  Pulse: 87 85  Temp: 98.1 F (36.7 C) 98.4 F (36.9 C)  Resp: 16 16    Physical Exam: Lungs:  Non labored Extremities:  LLE with thigh high TED hose in place.  Leg still swollen but softer throughout   CBC    Component Value Date/Time   WBC 10.8* 06/03/2015 0735   RBC 3.46* 06/03/2015 0735   HGB 10.1* 06/03/2015 0735   HCT 28.3* 06/03/2015 0735   PLT 227 06/03/2015 0735   MCV 81.8 06/03/2015 0735   MCH 29.2 06/03/2015 0735   MCHC 35.7 06/03/2015 0735   RDW 12.9 06/03/2015 0735    BMET    Component Value Date/Time   NA 135 06/02/2015 0438   K 4.0 06/02/2015 0438   CL 101 06/02/2015 0438   CO2 25 06/02/2015 0438   GLUCOSE 110* 06/02/2015 0438   BUN 14 06/02/2015 0438   CREATININE 0.86 06/02/2015 0438   CALCIUM 8.6* 06/02/2015 0438   GFRNONAA >60 06/02/2015 0438   GFRAA >60 06/02/2015 0438    INR    Component Value Date/Time   INR 1.28 05/31/2015 1935     Intake/Output Summary (Last 24 hours) at 06/04/15 0741 Last data filed at 06/04/15 1610  Gross per 24 hour  Intake    305 ml  Output   2050 ml  Net  -1745 ml     Assessment:  65 y.o. male is s/p:  LLE DVT TPA assisted lysis with PTA by IR  Plan: -pt continues to require less pain medication -DVT prophylaxis:  Heparin gtt-anticipate Xarelto will be started today and heparin discontinued.  Will d/w Dr. Darrick Penna -PT consult ordered, hopefully they will see him today   Doreatha Massed, PA-C [facility] [phone] 06/04/2015 7:41 AM  D/c home today Resume xarelto  Fabienne Bruns, MD [facility] [place] Office:  [phone] Pager: [phone]

## 2015-06-04 NOTE — Evaluation (Signed)
Physical Therapy Evaluation Patient Details Name: Barry Rice MRN: [id number] DOB: 04-Sep-1950 Today's Date: 06/04/2015   History of Present Illness  Patient is a 65 y/o male with hx of depression and restless leg syndrome presents with acute DVT throughout the left leg with superficial thrombus in the left proximal greater saphenous vein s/p TPA assisted lysis with PTA by IR.  Clinical Impression  Patient presents with pain, decreased strength/AROM, swelling and decreased WB through LLE impacting safe mobility. Balance improved with use of RW. Not safe to use crutches due to LOB. Lengthy discussion/demonstration performing stair negotiation with wife present. Education re: exercises, AROM, elevation, WB etc. Pt will have support from wife at home. Pt and wife understand need for assist with stair training at home. Will follow acutely to maximize independence and mobility prior to return home.    Follow Up Recommendations No PT follow up;Supervision for mobility/OOB    Equipment Recommendations  None recommended by PT    Recommendations for Other Services OT consult     Precautions / Restrictions Precautions Precautions: Fall Restrictions Weight Bearing Restrictions: No      Mobility  Bed Mobility Overal bed mobility: Modified Independent Bed Mobility: Supine to Sit;Sit to Supine     Supine to sit: Modified independent (Device/Increase time) Sit to supine: Modified independent (Device/Increase time)   General bed mobility comments: HOB flat, use of rail to get to EOB. Increased time. Performed x2.  Transfers Overall transfer level: Needs assistance Equipment used: Rolling walker (2 wheeled);Crutches Transfers: Sit to/from Stand Sit to Stand: Min assist;Min guard         General transfer comment: Stood x1 with use of RW, x1 with crutches. LOB upon standing with crutches requiring Min A, Min guard for RW. Cues for hand placement/technique. Transferred to chair with Min A.    Ambulation/Gait Ambulation/Gait assistance: Mod assist;Min guard Ambulation Distance (Feet): 50 Feet (+15') Assistive device: Rolling walker (2 wheeled);Crutches Gait Pattern/deviations: Step-to pattern;Trunk flexed;Staggering right;Staggering left ("hop to")   Gait velocity interpretation: Below normal speed for age/gender General Gait Details: Min guard using RW for stability. No LOB. Pt not placing weight through LLE. LOB when using crutches requiring Mod A to prevent fall.   Stairs Stairs: Yes Stairs assistance: Min assist Stair Management: Backwards;With walker   General stair comments: Wife present during session. Ascended steps backwards with wife supporting RW, Min A for balance. Descending holding onto 2 rails. DIscussed other options, butt scooting. Wife educated on positioning and need of 2nd person for assist.   Wheelchair Mobility    Modified Rankin (Stroke Patients Only)       Balance Overall balance assessment: Needs assistance Sitting-balance support: Feet supported;No upper extremity supported Sitting balance-Leahy Scale: Good Sitting balance - Comments: Pt leaning on right hip as position of comfort due to pain. Postural control: Right lateral lean Standing balance support: During functional activity;Single extremity supported Standing balance-Leahy Scale: Fair Standing balance comment: Requires UE support for balance at all times.                              Pertinent Vitals/Pain Pain Assessment: 0-10 Pain Score: 9  Pain Location: LLE in dependent position Pain Descriptors / Indicators: Throbbing;Sore;Aching Pain Intervention(s): Monitored during session;Repositioned;Limited activity within patient's tolerance;Premedicated before session    Home Living Family/patient expects to be discharged to:: Private residence Living Arrangements: Spouse/significant other Available Help at Discharge: Family;Available 24 hours/day Type of Home:  House  Home Access: Stairs to enter Entrance Stairs-Rails: Right Entrance Stairs-Number of Steps: 4 Home Layout: One level Home Equipment: Walker - 2 wheels;Cane - single point      Prior Function Level of Independence: Independent               Hand Dominance        Extremity/Trunk Assessment   Upper Extremity Assessment: Defer to OT evaluation           Lower Extremity Assessment: LLE deficits/detail   LLE Deficits / Details: Swelling present LLE; Limited AROM secondary to pain.      Communication   Communication: No difficulties  Cognition Arousal/Alertness: Awake/alert Behavior During Therapy: WFL for tasks assessed/performed Overall Cognitive Status: Within Functional Limits for tasks assessed                      General Comments General comments (skin integrity, edema, etc.): Wife present during session. Discussed elevation, edema management, AROM, exercises and stair negotiation technique.    Exercises        Assessment/Plan    PT Assessment Patient needs continued PT services  PT Diagnosis Difficulty walking;Acute pain   PT Problem List Decreased strength;Pain;Decreased range of motion;Decreased activity tolerance;Decreased balance;Decreased mobility;Decreased knowledge of use of DME  PT Treatment Interventions Balance training;Gait training;Functional mobility training;Therapeutic activities;Therapeutic exercise;Patient/family education;Stair training;DME instruction   PT Goals (Current goals can be found in the Care Plan section) Acute Rehab PT Goals Patient Stated Goal: to go home today PT Goal Formulation: With patient/family Time For Goal Achievement: 06/18/15 Potential to Achieve Goals: Good    Frequency Min 3X/week   Barriers to discharge Inaccessible home environment 4 steps to enter home    Co-evaluation               End of Session Equipment Utilized During Treatment: Gait belt Activity Tolerance: Patient limited  by pain;Patient tolerated treatment well Patient left: in chair;with call bell/phone within reach;with family/visitor present           Time: 0930-1010 PT Time Calculation (min) (ACUTE ONLY): 40 min   Charges:   PT Evaluation $PT Eval Moderate Complexity: 1 Procedure PT Treatments $Gait Training: 23-37 mins   PT G Codes:        Michayla Mcneil A Vencil Basnett 06/04/2015, 10:26 AM  Mylo Red, PT, DPT [phone]

## 2015-06-04 NOTE — Progress Notes (Signed)
Utilization review completed.  

## 2015-06-04 NOTE — Discharge Summary (Signed)
Discharge Summary    Algis Lehenbauer 04-20-1951 65 y.o. male  [id number]  Admission Date: 05/31/2015  Discharge Date: 06/04/15  Physician: Sherren Kerns, MD  Admission Diagnosis: Extensive DVT of left lower extremity   HPI:   This is a 65 y.o. male who states that last Saturday (11 days ago), he was in his garage doing some work bending down. When he stood up, he felt a pop in his left groin. He states that he had swelling in the left thigh. He went to bed with his leg elevated and when he woke, his entire leg was swollen down to the ankle. He went to the ER that day and was told he had a groin strain. His wife states they did not do any tests and told them they did not think it was a DVT. He continued to have swelling and had a venous ultrasound on 05/25/15, which revealed acute DVT throughout the left leg with superficial thrombus in the left proximal greater saphenous vein. His pain is not well controlled on the hydrocodone, which he was taking every 8 hours and has been increased to every 6 hours.  The pt was placed on Xarelto last Friday, 05/25/15. Dr. Link Snuffer referred the pt to Dr. Darrick Penna for further evaluation.  The pt states he last traveled to the [place] in August 2016. He does have family hx of DVT with his brother who is on Warfarin for this.   He has a hx of left hernia repair with mesh ~ 12 years ago and trigger finger release. He has hx of arthritis, restless leg syndrome and some mild depression. He does not have any hx of bleeding (GIB or brain).  Hospital Course:  The patient was admitted to the hospital and taken to interventional radiology and underwent: LLE DVT lysis, thrombectomy and venous 10, 12, 14mm overlapping PTA with near complete resolution of the iliofemoral DVT, with a restored channel of venous outflow.  He was continued on heparin.      The pt tolerated the procedure well and was transported to the PACU in good condition.   By the second day,  he was much more comfortable.  He did continue to have swelling in his LLE.  He was transferred out of the ICU to the telemetry floor.  On day 3, he was converted back to his oral agent, Xarelto, heparin discontinued and he was discharged home with instructions to wear this thigh high TED hose.    The remainder of the hospital course consisted of increasing mobilization and increasing intake of solids without difficulty.  CBC    Component Value Date/Time   WBC 10.8* 06/03/2015 0735   RBC 3.46* 06/03/2015 0735   HGB 10.1* 06/03/2015 0735   HCT 28.3* 06/03/2015 0735   PLT 227 06/03/2015 0735   MCV 81.8 06/03/2015 0735   MCH 29.2 06/03/2015 0735   MCHC 35.7 06/03/2015 0735   RDW 12.9 06/03/2015 0735    BMET    Component Value Date/Time   NA 135 06/02/2015 0438   K 4.0 06/02/2015 0438   CL 101 06/02/2015 0438   CO2 25 06/02/2015 0438   GLUCOSE 110* 06/02/2015 0438   BUN 14 06/02/2015 0438   CREATININE 0.86 06/02/2015 0438   CALCIUM 8.6* 06/02/2015 0438   GFRNONAA >60 06/02/2015 0438   GFRAA >60 06/02/2015 0438      Discharge Instructions    Call MD for:  redness, tenderness, or signs of infection (pain, swelling, bleeding, redness, odor  or green/yellow discharge around incision site)    Complete by:  As directed      Call MD for:  severe or increased pain, loss or decreased feeling  in affected limb(s)    Complete by:  As directed      Call MD for:  temperature >100.5    Complete by:  As directed      Discharge wound care:    Complete by:  As directed   Shower daily with soap and water starting 06/05/15     Driving Restrictions    Complete by:  As directed   No driving for 2 weeks     Lifting restrictions    Complete by:  As directed   No lifting for 2 weeks     Resume previous diet    Complete by:  As directed            Discharge Diagnosis:  Extensive DVT of left lower extremity  Secondary Diagnosis: Patient Active Problem List   Diagnosis Date Noted  .  Acute DVT (deep venous thrombosis) (HCC) 05/31/2015  . Acute DVT (deep venous thrombosis), left 05/31/2015   Past Medical History  Diagnosis Date  . Hernia, inguinal   . Arthritis   . Restless leg syndrome   . Depression        Medication List    TAKE these medications        Hydrocodone-Acetaminophen 7.5-300 MG Tabs  Take 1 tablet by mouth every 6 (six) hours.     PARoxetine 20 MG tablet  Commonly known as:  PAXIL  Take 10 mg by mouth daily. Reported on 05/31/2015     rOPINIRole 0.5 MG tablet  Commonly known as:  REQUIP  Take 0.5 mg by mouth at bedtime. Reported on 05/31/2015     XARELTO 20 MG Tabs tablet  Generic drug:  rivaroxaban  Take 15 mg by mouth 2 (two) times daily.        Prescriptions given: none  Instructions: 1.  Continue thigh high TED hose  Disposition: home  Patient's condition: is Good  Follow up: 1. Dr. Darrick Penna in 2 weeks 2. Dr. Miles Costain in 3 weeks.   Doreatha Massed, PA-C [facility] [phone] 06/04/2015  1:45 PM

## 2015-06-04 NOTE — Telephone Encounter (Addendum)
-----   Message from Sharee Pimple, RN sent at 06/04/2015  2:44 PM EST ----- Regarding: schedule   ----- Message -----    From: Dara Lords, PA-C    Sent: 06/04/2015   1:42 PM      To: [facility] Charge Pool  Fu with dr fields in 2 weeks  Thanks  notified patient of fu appt. with dr. Darrick Penna on 06-21-15 at 11:30

## 2015-06-04 NOTE — Progress Notes (Signed)
ANTICOAGULATION CONSULT NOTE - Initial Consult  Pharmacy Consult for Xarelto Indication: DVT  No Known Allergies  Patient Measurements: Height:  (185.4 cm) Weight: 184 lb 4.9 oz (83.6 kg) IBW/kg (Calculated) : 79.9  Vital Signs: Temp: 98.4 F (36.9 C) (01/30 0418) Temp Source: Oral (01/30 0418) BP: 127/63 mmHg (01/30 0418) Pulse Rate: 85 (01/30 0418)  Labs:  Recent Labs  06/02/15 0438 06/02/15 0935 06/03/15 0735 06/03/15 1600 06/04/15 0059 06/04/15 0300  HGB 11.3*  --  10.1*  --   --   --   HCT 33.7*  --  28.3*  --   --   --   PLT 244  --  227  --   --   --   APTT 77* 67* 54* 45*  --   --   HEPARINUNFRC  --   --  0.19* 0.21* 0.35 0.34  CREATININE 0.86  --   --   --   --   --     Estimated Creatinine Clearance: 99.4 mL/min (by C-G formula based on Cr of 0.86).   Medical History: Past Medical History  Diagnosis Date  . Hernia, inguinal   . Arthritis   . Restless leg syndrome   . Depression      Assessment:  Anticoagulation: Heparin gtt for extensive DVT. Xarelto PTA (last dose 1/26 AM) for LLE DVT (from 05/25/15). Catheter-directed thrombolysis with TNKase completed 1/28. Heparin level 0.35 and 0.34 in goal. Need CBC 1/30 (Last Hgb 11.3>10.1) Plts 372>227. - LLE with thigh high TED hose in place. Swollen but improved  Goal of Therapy:  Therapeutic oral anticoagulation Monitor platelets by anticoagulation protocol: Yes   Plan:  1. Resume Xarelto  BID x 21d through 2/11 (1/30 is day 10 from original start of treatment) 2. Then start Xarelto  daily.   Zlaty Alexa S. Merilynn Finland, PharmD, BCPS Clinical Staff Pharmacist Pager [phone]  Misty Stanley Stillinger 06/04/2015,2:23 PM

## 2015-06-04 NOTE — Progress Notes (Signed)
Dc instructions given to pt at this time.  Pt verbalized understanding of all instructions.  No c/o pain or s/s of any acute distress noted

## 2015-06-07 ENCOUNTER — Other Ambulatory Visit (HOSPITAL_COMMUNITY): Payer: Self-pay | Admitting: Interventional Radiology

## 2015-06-07 DIAGNOSIS — I82402 Acute embolism and thrombosis of unspecified deep veins of left lower extremity: Secondary | ICD-10-CM

## 2015-06-14 ENCOUNTER — Encounter: Payer: Self-pay | Admitting: Vascular Surgery

## 2015-06-21 ENCOUNTER — Ambulatory Visit (INDEPENDENT_AMBULATORY_CARE_PROVIDER_SITE_OTHER): Payer: BLUE CROSS/BLUE SHIELD | Admitting: Vascular Surgery

## 2015-06-21 ENCOUNTER — Encounter: Payer: Self-pay | Admitting: Vascular Surgery

## 2015-06-21 VITALS — BP 119/70 | HR 84 | Temp 97.3°F | Resp 16 | Ht 77.0 in | Wt 200.0 lb

## 2015-06-21 DIAGNOSIS — I82402 Acute embolism and thrombosis of unspecified deep veins of left lower extremity: Secondary | ICD-10-CM

## 2015-06-21 NOTE — Progress Notes (Signed)
[facility]  Patient name: Barry Rice MRN: [id number] DOB: Dec 01, 1950 Sex: male  REASON FOR VISIT: Follow-up thrombolysis after DVT.  HPI: Barry Rice is a 65 y.o. male who returns for follow-up today. He recently underwent thrombolysis of his for acute DVT 06/02/2015. He is currently on Xarelto. He still has some mild residual swelling from below the knee to the foot in the left leg. This held over is improved significantly. He has minimal pain at this point. He is wearing a 20-30 mm compression stocking. He denies shortness of breath or chest pain. He has no hemoptysis. He has had no bleeding per rectum.  Past Medical History  Diagnosis Date  . Hernia, inguinal   . Arthritis   . Restless leg syndrome   . Depression     Family History  Problem Relation Age of Onset  . Heart disease Mother   . Arthritis Mother   . Hypertension Father   . Stroke Father     SOCIAL HISTORY: Social History  Substance Use Topics  . Smoking status: Never Smoker   . Smokeless tobacco: Not on file  . Alcohol Use: Yes     Comment: 1 beer a night    No Known Allergies  Current Outpatient Prescriptions  Medication Sig Dispense Refill  . Hydrocodone-Acetaminophen 7.5-300 MG TABS Take 1 tablet by mouth every 6 (six) hours.     Marland Kitchen PARoxetine (PAXIL) 20 MG tablet Take 10 mg by mouth daily. Reported on 05/31/2015    . rOPINIRole (REQUIP) 0.5 MG tablet Take 0.5 mg by mouth at bedtime. Reported on 05/31/2015    . XARELTO 20 MG TABS tablet Take 15 mg by mouth 2 (two) times daily.      No current facility-administered medications for this visit.    REVIEW OF SYSTEMS:   denotes positive finding,  denotes negative finding Cardiac  Comments:  Chest pain or chest pressure:    Shortness of breath upon exertion:    Short of breath when lying flat:    Irregular heart rhythm:        Vascular    Pain in calf, thigh, or hip brought on by ambulation:    Pain in feet at  night that wakes you up from your sleep:     Blood clot in your veins:    Leg swelling:         Pulmonary    Oxygen at home:    Productive cough:     Wheezing:         Neurologic    Sudden weakness in arms or legs:     Sudden numbness in arms or legs:     Sudden onset of difficulty speaking or slurred speech:    Temporary loss of vision in one eye:     Problems with dizziness:         Gastrointestinal    Blood in stool:     Vomited blood:         Genitourinary    Burning when urinating:     Blood in urine:        Psychiatric    Major depression:         Hematologic    Bleeding problems:    Problems with blood clotting too easily:        Skin    Rashes or ulcers:        Constitutional    Fever or chills:  PHYSICAL EXAM: There were no vitals filed for this visit.  GENERAL: The patient is a well-nourished male, in no acute distress. The vital signs are documented above. VASCULAR: 2+ posterior tibial pulse left foot  PULMONARY: There is good air exchange bilaterally without wheezing or rales. MUSCULOSKELETAL: There are no major deformities or cyanosis. Left leg approximately 10% larger than the right from the knee down NEUROLOGIC: No focal weakness or paresthesias are detected. SKIN: There are no ulcers or rashes noted. PSYCHIATRIC: The patient has a normal affect.   MEDICAL ISSUES: I would continue his Xarelto for a full 6 months. Patient will follow-up with me on an as-needed basis.  Fabienne Bruns [facility] [employer] [facility] [employer]: [phone]

## 2015-07-05 ENCOUNTER — Telehealth: Payer: Self-pay | Admitting: Radiology

## 2015-07-05 ENCOUNTER — Ambulatory Visit
Admission: RE | Admit: 2015-07-05 | Discharge: 2015-07-05 | Disposition: A | Discharge status: Home / Self Care | Payer: BLUE CROSS/BLUE SHIELD | Source: Ambulatory Visit | Attending: Interventional Radiology | Admitting: Interventional Radiology

## 2015-07-05 DIAGNOSIS — I82402 Acute embolism and thrombosis of unspecified deep veins of left lower extremity: Secondary | ICD-10-CM

## 2015-07-05 NOTE — Progress Notes (Signed)
Patient ID: Barry Rice, male   DOB: Jan 13, 1951, 65 y.o.   MRN: [id number]       Chief Complaint: Acute extensive left iliofemoral DVT with marked left lower extremity edema and pain.   Referring Physician(s): fields  History of Present Illness: Barry Rice is a 65 y.o. male who returns for outpatient follow-up. He recently underwent left lower extremity transcatheter thrombolysis and thrombectomy for an extensive acute left iliofemoral DVT 06/02/2015. He is now on Xarelto as an outpatient. Over the last month he continues to improve. He reports significant improvement in the symptoms of his left lower extremity. He has minor residual calf edema. No significant pain. He has been compliant with 20/30 mm Hg compression stockings. He is beginning to exercise again. No current chest pain, shortness of breath, or bleeding complication.  Past Medical History  Diagnosis Date  . Hernia, inguinal   . Arthritis   . Restless leg syndrome   . Depression     Past Surgical History  Procedure Laterality Date  . Hernia repair    . Trigger finger release    . Wisdom tooth extraction      Allergies: Review of patient's allergies indicates no known allergies.  Medications: Prior to Admission medications   Medication Sig Start Date End Date Taking? Authorizing Provider  Hydrocodone-Acetaminophen 7.5-300 MG TABS Take 1 tablet by mouth every 6 (six) hours.  05/25/15  Yes Historical Provider, MD  PARoxetine (PAXIL) 20 MG tablet Take 10 mg by mouth daily. Reported on 05/31/2015 03/22/15  Yes Historical Provider, MD  rOPINIRole (REQUIP) 0.5 MG tablet Take 0.5 mg by mouth at bedtime. Reported on 05/31/2015 05/04/15  Yes Historical Provider, MD  XARELTO 20 MG TABS tablet Take 20 mg by mouth every morning. Reported on 07/05/2015 05/28/15   Historical Provider, MD     Family History  Problem Relation Age of Onset  . Heart disease Mother   . Arthritis Mother   . Hypertension Father   . Stroke Father      Social History   Social History  . Marital Status: Married    Spouse Name: N/A  . Number of Children: N/A  . Years of Education: N/A   Social History Main Topics  . Smoking status: Never Smoker   . Smokeless tobacco: Not on file  . Alcohol Use: Yes     Comment: 1 beer a night  . Drug Use: No  . Sexual Activity: Not on file   Other Topics Concern  . Not on file   Social History Narrative    Review of Systems: A 12 point ROS discussed and pertinent positives are indicated in the HPI above.  All other systems are negative.  Review of Systems  Vital Signs: BP 142/75 mmHg  Pulse 76  Temp(Src) 98 F (36.7 C) (Oral)  Resp 14  Ht 6' 0.5" (1.842 m)  Wt 200 lb (90.719 kg)  BMI 26.74 kg/m2  SpO2 99%  Physical Exam  Constitutional: He appears well-developed and well-nourished. No distress.  Musculoskeletal: He exhibits no tenderness.  Mild residual +1 left lower extremity calf edema. No residual thigh edema. Normal pedal pulses. No skin lesions. Overall the left lower extremity has significantly improved compared to January.  Skin: He is not diaphoretic.     Imaging: US Venous Img Lower Unilateral Left  07/05/2015  CLINICAL DATA:  One month status post extensive left lower extremity ileal femoral and popliteal DVT thrombo lysis /thrombectomy EXAM: LEFT LOWER EXTREMITY VENOUS DOPPLER ULTRASOUND TECHNIQUE:  Gray-scale sonography with graded compression, as well as color Doppler and duplex ultrasound were performed to evaluate the lower extremity deep venous systems from the level of the common femoral vein and including the common femoral, femoral, profunda femoral, popliteal and calf veins including the posterior tibial, peroneal and gastrocnemius veins when visible. The superficial great saphenous vein was also interrogated. Spectral Doppler was utilized to evaluate flow at rest and with distal augmentation maneuvers in the common femoral, femoral and popliteal veins.  COMPARISON:  06/02/2015 FINDINGS: Contralateral Common Femoral Vein: Respiratory phasicity is normal and symmetric with the symptomatic side. No evidence of thrombus. Normal compressibility. Common Femoral Vein: No evidence of thrombus. Normal compressibility, respiratory phasicity and response to augmentation. Saphenofemoral Junction: No evidence of thrombus. Normal compressibility and flow on color Doppler imaging. Profunda Femoral Vein: Proximal aspect appears patent. Residual hypoechoic intraluminal thrombus extends into the main profunda femoral vein appearing occlusive. Femoral Vein: Proximal femoral vein appears patent. Mid and distal femoral vein segments demonstrate residual intraluminal occlusive thrombus and are noncompressible. Small branching collateral veins are patent in the distal femoral region. Popliteal Vein: Hypoechoic intraluminal occlusive thrombus evident. Small surrounding branching collaterals noted. Calf Veins: Difficult to assess the calf veins. Small segments of the tibial and peroneal calf veins are patent compatible with areas of recanalization. Superficial Great Saphenous Vein: No evidence of thrombus. Normal compressibility and flow on color Doppler imaging. Venous Reflux:  None. Other Findings:  None. IMPRESSION: Residual left lower extremity DVT from the mid femoral vein to the popliteal vein. Proximal left femoral vein and the left common femoral vein are widely patent and easily compressible indicating that the left iliac veins are patent. Electronically Signed   By: Judie Petit.  Shir Bergman M.D.   On: 07/05/2015 11:46    Labs:  CBC:  Recent Labs  05/31/15 1935 06/02/15 0438 06/03/15 0735  WBC 9.2 12.9* 10.8*  HGB 11.0* 11.3* 10.1*  HCT 32.9* 33.7* 28.3*  PLT 372 244 227    COAGS:  Recent Labs  05/31/15 1935  06/02/15 0438 06/02/15 0935 06/03/15 0735 06/03/15 1600  INR 1.28  --   --   --   --   --   APTT 38*  < > 77* 67* 54* 45*  < > = values in this interval not  displayed.  BMP:  Recent Labs  05/31/15 1935 06/02/15 0438  NA 137 135  K 3.9 4.0  CL 98* 101  CO2 28 25  GLUCOSE 116* 110*  BUN 15 14  CALCIUM 9.0 8.6*  CREATININE 0.86 0.86  GFRNONAA >60 >60  GFRAA >60 >60    LIVER FUNCTION TESTS:  Recent Labs  05/31/15 1935  BILITOT 0.1*  AST 103*  ALT 113*  ALKPHOS 245*  PROT 6.9  ALBUMIN 2.5*    TUMOR MARKERS: No results for input(s): AFPTM, CEA, CA199, CHROMGRNA in the last 8760 hours.  Assessment and Plan:  1 month status post transcatheter thrombo-lysis and thrombectomy of the extensive left lower extremity iliofemoral occlusive DVT and symptomatic swollen and painful left lower extremity. Over the last month he has done very well at home. Marked reduction in left lower extremity edema, discomfort and fatigue. He reports mild residual calf edema.  Ultrasound today demonstrates residual left lower extremity DVT from the mid femoral vein into the popliteal vein. The iliac, common femoral, and proximal femoral veins are patent. He is ambulating without difficulty. He is starting to exercise again. He has been compliant with a 20/30 mmHg compression stocking.  Plan: Hematology referral for hypercoagulable workup. Continue Xarelto for 6 months if not longer based on hematology recommendations. Continue advancing daily activity as tolerated and wearing the 20/30 mmHg compression stocking Follow-up as needed.   Thank you for this interesting consult.  I greatly enjoyed meeting Avinash Maltos and look forward to participating in their care.  A copy of this report was sent to the requesting provider on this date.  Electronically Signed: Berdine Dance 07/05/2015, 1:31 PM   I spent a total of    25 Minutes in face to face in clinical consultation, greater than 50% of which was counseling/coordinating care for this patient with previous acute occlusive left iliofemoral DVT, status post thrombolysis/thrombectomy.

## 2015-07-06 ENCOUNTER — Encounter: Payer: Self-pay | Admitting: Gastroenterology

## 2015-07-06 ENCOUNTER — Other Ambulatory Visit (HOSPITAL_COMMUNITY): Payer: Self-pay | Admitting: Urology

## 2015-07-06 DIAGNOSIS — R972 Elevated prostate specific antigen [PSA]: Secondary | ICD-10-CM

## 2015-07-10 ENCOUNTER — Telehealth: Payer: Self-pay | Admitting: Hematology

## 2015-07-10 NOTE — Telephone Encounter (Signed)
Pt aware of np appt on 07/19/15@2 :30 Referring Dr. Miles CostainShick DX-DVT Hypercoagulable workup Mailed np packet

## 2015-07-10 NOTE — Telephone Encounter (Signed)
Pt's address and insurance are correct.

## 2015-07-19 ENCOUNTER — Ambulatory Visit (HOSPITAL_BASED_OUTPATIENT_CLINIC_OR_DEPARTMENT_OTHER): Payer: BLUE CROSS/BLUE SHIELD | Admitting: Hematology

## 2015-07-19 ENCOUNTER — Telehealth: Payer: Self-pay | Admitting: Hematology

## 2015-07-19 ENCOUNTER — Encounter: Payer: Self-pay | Admitting: Hematology

## 2015-07-19 VITALS — BP 156/66 | HR 81 | Temp 97.8°F | Resp 19 | Wt 200.5 lb

## 2015-07-19 DIAGNOSIS — D6489 Other specified anemias: Secondary | ICD-10-CM

## 2015-07-19 DIAGNOSIS — R972 Elevated prostate specific antigen [PSA]: Secondary | ICD-10-CM | POA: Diagnosis not present

## 2015-07-19 DIAGNOSIS — K7689 Other specified diseases of liver: Secondary | ICD-10-CM

## 2015-07-19 DIAGNOSIS — R945 Abnormal results of liver function studies: Secondary | ICD-10-CM | POA: Insufficient documentation

## 2015-07-19 DIAGNOSIS — R760 Raised antibody titer: Secondary | ICD-10-CM

## 2015-07-19 DIAGNOSIS — D649 Anemia, unspecified: Secondary | ICD-10-CM | POA: Insufficient documentation

## 2015-07-19 DIAGNOSIS — R790 Abnormal level of blood mineral: Secondary | ICD-10-CM

## 2015-07-19 DIAGNOSIS — I82402 Acute embolism and thrombosis of unspecified deep veins of left lower extremity: Secondary | ICD-10-CM | POA: Diagnosis not present

## 2015-07-19 NOTE — Progress Notes (Signed)
[facility]    HEMATOLOGY/ONCOLOGY CONSULTATION NOTE  Date of Service: 07/19/2015  Patient Care Team: Velna Hatchet, MD as PCP - General (Internal Medicine)  CHIEF COMPLAINTS/PURPOSE OF CONSULTATION:  Acute left lower extremity DVT -unprovoked   HISTORY OF PRESENTING ILLNESS:   Barry Rice is a wonderful 65 y.o. male who has been referred to [place] by Dr Ardeth Perfect for evaluation and management of unprovoked left lower extremity DVT.  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 Patient has generally been in good health and in mid January after having been crouching down doing some DIY work developed acute onset left lower extremity swelling and discomfort. Patient reports he presented to the emergency room on 05/20/2015 and was diagnosed as having a left groin strain. Patient subsequently presented to his primary care physician and had an ultrasound of his left lower extremity on 05/25/2015 that showed an acute DVT throughout the left lower extremity with superficial thrombus in the left proximal greater saphenous vein. Patient was placed on Rivaroxaban on 05/25/2015. He was admitted to the hospital and had A Catheter Directed Left Lower Extremity thrombolysis and thrombectomy with near complete resolution of ilio-femoral DVT. He was briefly placed on IV heparin while he was in the hospital and was discharged home eventually on Rivaroxaban.  Patient notes that he has been taking his Rivaroxaban regularly and his left lower extremity swelling is much improved. Has been using a compression sock and notes that it keeps is swelling down significantly.  Patient was next smoker but quit about 27 years ago. No previous personal history of venous thromboembolism or arterial thrombosis. One of his brothers was a smoker had a history of DVT and was treated with warfarin. Father had phlebitis.  Patient reports no  recent immobilization. Last treated to the [place] was in August 2016. He has had a history of a left inguinal hernia repair with mesh about 2 years ago but reports that no specific scar tissue or venous compression was noted that was thought to be obstructing to venous flow.  He has been referred to [place] for evaluation of a possible hypercoagulable state.   MEDICAL HISTORY:  Past Medical History  Diagnosis Date  . Hernia, inguinal   . Arthritis   . Restless leg syndrome   . Depression   Elevated PSA Ex-smoker Chronic mid back pain from arthritis thought to be degenerative has been multiple NSAIDs. He was diagnosed as having HLA-B27 associated seronegative arthritis.  SURGICAL HISTORY: Past Surgical History  Procedure Laterality Date  . Hernia repair    . Trigger finger release    . Wisdom tooth extraction    Left lower extremity DVT catheter directed thrombolysis in January 2017   SOCIAL HISTORY: Social History   Social History  . Marital Status: Married    Spouse Name: N/A  . Number of Children: N/A  . Years of Education: N/A   Occupational History  . Not on file.   Social History Main Topics  . Smoking status: Never Smoker   . Smokeless tobacco: Not on file  . Alcohol Use: Yes     Comment: 1 beer a night  . Drug Use: No  . Sexual Activity: Not on file   Other Topics Concern  . Not on file   Social History Narrative    FAMILY HISTORY: Family History  Problem Relation Age of Onset  . Heart disease Mother   .  Arthritis Mother   . Hypertension Father   . Stroke Father     ALLERGIES:  has No Known Allergies.  MEDICATIONS:  Current Outpatient Prescriptions  Medication Sig Dispense Refill  . Hydrocodone-Acetaminophen 7.5-300 MG TABS Take 1 tablet by mouth every 6 (six) hours.     [facility] Kitchen PARoxetine (PAXIL) 20 MG tablet Take 10 mg by mouth daily. Reported on 05/31/2015    . rOPINIRole (REQUIP) 0.5 MG tablet Take 0.5 mg by mouth at bedtime. Reported on 05/31/2015    .  XARELTO 20 MG TABS tablet Take 20 mg by mouth every morning. Reported on 07/05/2015     No current facility-administered medications for this visit.    REVIEW OF SYSTEMS:    10 Point review of Systems was done is negative except as noted above.  PHYSICAL EXAMINATION: ECOG PERFORMANCE STATUS: 1 - Symptomatic but completely ambulatory  . Filed Vitals:   07/19/15 1451  BP: 156/66  Pulse: 81  Temp: 97.8 F (36.6 C)  Resp: 19   Filed Weights   07/19/15 1451  Weight: 200 lb 8 oz (90.946 kg)   .Body mass index is 26.8 kg/(m^2).  GENERAL:alert, in no acute distress and comfortable SKIN: skin color, texture, turgor are normal, no rashes or significant lesions EYES: normal, conjunctiva are pink and non-injected, sclera clear OROPHARYNX:no exudate, no erythema and lips, buccal mucosa, and tongue normal  NECK: supple, no JVD, thyroid normal size, non-tender, without nodularity LYMPH:  no palpable lymphadenopathy in the cervical, axillary or inguinal LUNGS: clear to auscultation with normal respiratory effort HEART: regular rate & rhythm,  no murmur ABDOMEN: abdomen soft, non-tender, normoactive bowel sounds  Musculoskeletal: no cyanosis of digits and no clubbing , Minimal left lower extremity swelling with compression sock on. PSYCH: alert & oriented x 3 with fluent speech NEURO: no focal motor/sensory deficits  LABORATORY DATA:  I have reviewed the data as listed  . CBC Latest Ref Rng 07/20/2015 06/03/2015 06/02/2015  WBC 4.0 - 10.3 10e3/uL 4.6 10.8(H) 12.9(H)  Hemoglobin 13.0 - 17.1 g/dL 13.7 10.1(L) 11.3(L)  Hematocrit 38.4 - 49.9 % 40.2 28.3(L) 33.7(L)  Platelets 140 - 400 10e3/uL 202 227 244    . CMP Latest Ref Rng 07/20/2015 06/02/2015 05/31/2015  Glucose 70 - 140 mg/dl 93 110(H) 116(H)  BUN 7.0 - 26.0 mg/dL 16.5 14 15   Creatinine 0.7 - 1.3 mg/dL 0.9 0.86 0.86  Sodium 136 - 145 mEq/L 142 135 137  Potassium 3.5 - 5.1 mEq/L 4.1 4.0 3.9  Chloride 101 - 111 mmol/L - 101 98(L)    CO2 22 - 29 mEq/L 24 25 28   Calcium 8.4 - 10.4 mg/dL 10.1 8.6(L) 9.0  Total Protein 6.4 - 8.3 g/dL 7.7 - 6.9  Total Bilirubin 0.20 - 1.20 mg/dL 0.43 - 0.1(L)  Alkaline Phos 40 - 150 U/L 81 - 245(H)  AST 5 - 34 U/L 24 - 103(H)  ALT 0 - 55 U/L 32 - 113(H)   Component     Latest Ref Rng 07/20/2015  Sodium     136 - 145 mEq/L 142  Potassium     3.5 - 5.1 mEq/L 4.1  Chloride     98 - 109 mEq/L 108  CO2     22 - 29 mEq/L 24  Glucose     70 - 140 mg/dl 93  BUN     7.0 - 26.0 mg/dL 16.5  Creatinine     0.7 - 1.3 mg/dL 0.9  Total Bilirubin  0.20 - 1.20 mg/dL 0.43  Alkaline Phosphatase     40 - 150 U/L 81  AST     5 - 34 U/L 24  ALT     0 - 55 U/L 32  Total Protein     6.4 - 8.3 g/dL 7.7  Albumin     3.5 - 5.0 g/dL 4.1  Calcium     8.4 - 10.4 mg/dL 10.1  Anion gap     3 - 11 mEq/L 11  EGFR     >90 ml/min/1.73 m2 >90  PTT-LA     0.0 - 43.6 sec 37.9  DRVVT     0.0 - 44.0 sec 73.9 (H)  dRVVT Mix     0.0 - 44.0 sec 55.3 (H)  dRVVT Confirm     0.8 - 1.2 ratio 1.5 (H)  Lupus Anticoag Interp      Comment:  Anticardiolipin Ab,IgG,Qn     0 - 14 GPL U/mL <9  Anticardiolipin Ab,IgM,Qn     0 - 12 MPL U/mL 9  Anticardiolipin Ab,IgA,Qn     0 - 11 APL U/mL <9  Beta-2 Glycoprotein I Ab, IgG     0 - 20 GPI IgG units <9  Beta-2 Glyco 1 IgA     0 - 25 GPI IgA units <9  Beta-2 Glyco 1 IgM     0 - 32 GPI IgM units <9  Sed Rate     0 - 30 mm/hr 9  Ferritin     22 - 316 ng/ml 108  D-dimer     0.00 - 0.49 mg/L FEU 0.42  LDH     125 - 245 U/L 163  PSA     0.0 - 4.0 ng/mL 6.4 (H)  Antithrombin Activity     75 - 135 % 137 (H)  Protein C Antigen     60 - 150 % 99  Protein C-Functional     73 - 180 % 155  Protein S-Functional     63 - 140 % 112  Protein S, Total     60 - 150 % 144   Lupus anticoagulant panel      No reference range information available      Comments:           Results are consistent with the presence of a lupus             Anticoagulant.   RADIOGRAPHIC STUDIES: I have personally reviewed the radiological images as listed and agreed with the findings in the report. US Venous Img Lower Unilateral Left  07/05/2015  CLINICAL DATA:  One month status post extensive left lower extremity ileal femoral and popliteal DVT thrombo lysis /thrombectomy EXAM: LEFT LOWER EXTREMITY VENOUS DOPPLER ULTRASOUND TECHNIQUE: Gray-scale sonography with graded compression, as well as color Doppler and duplex ultrasound were performed to evaluate the lower extremity deep venous systems from the level of the common femoral vein and including the common femoral, femoral, profunda femoral, popliteal and calf veins including the posterior tibial, peroneal and gastrocnemius veins when visible. The superficial great saphenous vein was also interrogated. Spectral Doppler was utilized to evaluate flow at rest and with distal augmentation maneuvers in the common femoral, femoral and popliteal veins. COMPARISON:  06/02/2015 FINDINGS: Contralateral Common Femoral Vein: Respiratory phasicity is normal and symmetric with the symptomatic side. No evidence of thrombus. Normal compressibility. Common Femoral Vein: No evidence of thrombus. Normal compressibility, respiratory phasicity and response to augmentation. Saphenofemoral Junction: No evidence of  thrombus. Normal compressibility and flow on color Doppler imaging. Profunda Femoral Vein: Proximal aspect appears patent. Residual hypoechoic intraluminal thrombus extends into the main profunda femoral vein appearing occlusive. Femoral Vein: Proximal femoral vein appears patent. Mid and distal femoral vein segments demonstrate residual intraluminal occlusive thrombus and are noncompressible. Small branching collateral veins are patent in the distal femoral region. Popliteal Vein: Hypoechoic intraluminal occlusive thrombus evident. Small surrounding branching collaterals noted. Calf Veins: Difficult to assess the calf  veins. Small segments of the tibial and peroneal calf veins are patent compatible with areas of recanalization. Superficial Great Saphenous Vein: No evidence of thrombus. Normal compressibility and flow on color Doppler imaging. Venous Reflux:  None. Other Findings:  None. IMPRESSION: Residual left lower extremity DVT from the mid femoral vein to the popliteal vein. Proximal left femoral vein and the left common femoral vein are widely patent and easily compressible indicating that the left iliac veins are patent. Electronically Signed   By: Jerilynn Mages.  Shick M.D.   On: 07/05/2015 11:46    US venous 05/25/2015:      ASSESSMENT & PLAN:   65 year old Caucasian gentleman with  #1 extensive left lower extremity unprovoked DVT in January 2017. Patient had left sided Ilio-femoral DVT extending into the leg and underwent catheter directed thrombolysis and thrombectomy by interventional radiology with significant improvement in symptoms. Patient is currently on Rivaroxaban and notes of his left lower extremity symptoms are much better. His d-dimer levels have normalized.  He had a thrombophilia workup which showed presence of a lupus anticoagulant antibody. Anticardiolipin antibody negative, beta-2 glycoprotein antibody negative. Factor V Leiden mutation and prothrombin gene mutation- pending Protein C and protein S levels within normal limits  CBC today shows no signs of a myeloproliferative neoplasm. No focal symptoms suggestive of malignancy at this time. LDH within normal limits. Myeloma panel pending - would be low yield. Was sent because of anemia. Her anemia has now resolved.  #2 lupus anticoagulant antibody positivity -concern for acquired thrombophilia. Plan - Would recommend repeating a lupus anticoagulant antibody in 3 months. If persistent would likely need long-term anticoagulation. -Continue Rivaroxaban for now for at least 6 months and possibly long-term. -Follow-up remaining  thrombophilia workup. -Patient to return to care in 3 months with repeat labs. -Follow-up with primary care physician for age-appropriate cancer screening. -Follow-up with urologist for workup of elevated PSA. -Patient is due for his colonoscopy - notes that he has never had one.  #2 Elevated PSA at 6.4 -Patient is being worked up by his urologist.  #3 Anemia -normocytic normochromic anemia with hemoglobin of 10.1 noted in January 2017. Likely related to blood loss with his catheter directed thrombolytic procedure. His hemoglobin today has normalized and is 13.7 with a normal MCV of 85. Ferritin level within normal limits. LDH normal.  #4 abnormal liver function tests noted in January 2017. ALT was noted to be 113, AST was 103, alkaline phosphatase 245. Repeat LFTs today are within normal limits. Was likely related to his thrombolytic therapy.  #5? 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 -Was using NSAIDs when necessary. -He has been counseled to avoid NSAIDs alongside anticoagulation due to increased risk of bleeding. -He might need a referral to rheumatology for consideration of other anti-inflammatory strategies.  All of the patients and his wife's  questions were answereto their apparent  satisfaction. The patient knows to call the clinic with any problems, questions or concerns.  I spent 60 minutes counseling the patient face to face.  The total time spent in the appointment was 60 minutes and more than 50% was on counseling and direct patient cares.    Sullivan Lone MD [facility] AAHIVMS [facility] Hematology/Oncology Physician [facility]  (Office):       [phone] (Work cell):  [phone] (Fax):           [phone]  07/19/2015 2:48 PM

## 2015-07-19 NOTE — Telephone Encounter (Signed)
per pof to sch pt appt-gave pt copy of avs °

## 2015-07-20 ENCOUNTER — Other Ambulatory Visit (HOSPITAL_BASED_OUTPATIENT_CLINIC_OR_DEPARTMENT_OTHER): Payer: BLUE CROSS/BLUE SHIELD

## 2015-07-20 DIAGNOSIS — D6489 Other specified anemias: Secondary | ICD-10-CM

## 2015-07-20 DIAGNOSIS — R972 Elevated prostate specific antigen [PSA]: Secondary | ICD-10-CM

## 2015-07-20 DIAGNOSIS — I82402 Acute embolism and thrombosis of unspecified deep veins of left lower extremity: Secondary | ICD-10-CM

## 2015-07-20 LAB — COMPREHENSIVE METABOLIC PANEL
ALBUMIN: 4.1 g/dL (ref 3.5–5.0)
ALK PHOS: 81 U/L (ref 40–150)
ALT: 32 U/L (ref 0–55)
ANION GAP: 11 meq/L (ref 3–11)
AST: 24 U/L (ref 5–34)
BILIRUBIN TOTAL: 0.43 mg/dL (ref 0.20–1.20)
BUN: 16.5 mg/dL (ref 7.0–26.0)
CO2: 24 mEq/L (ref 22–29)
Calcium: 10.1 mg/dL (ref 8.4–10.4)
Chloride: 108 mEq/L (ref 98–109)
Creatinine: 0.9 mg/dL (ref 0.7–1.3)
Glucose: 93 mg/dl (ref 70–140)
Potassium: 4.1 mEq/L (ref 3.5–5.1)
Sodium: 142 mEq/L (ref 136–145)
TOTAL PROTEIN: 7.7 g/dL (ref 6.4–8.3)

## 2015-07-20 LAB — CBC & DIFF AND RETIC
BASO%: 0.2 % (ref 0.0–2.0)
Basophils Absolute: 0 10*3/uL (ref 0.0–0.1)
EOS ABS: 0 10*3/uL (ref 0.0–0.5)
EOS%: 0 % (ref 0.0–7.0)
HCT: 40.2 % (ref 38.4–49.9)
HEMOGLOBIN: 13.7 g/dL (ref 13.0–17.1)
IMMATURE RETIC FRACT: 0.9 % — AB (ref 3.00–10.60)
LYMPH%: 29.5 % (ref 14.0–49.0)
MCH: 29 pg (ref 27.2–33.4)
MCHC: 34.1 g/dL (ref 32.0–36.0)
MCV: 85 fL (ref 79.3–98.0)
MONO#: 0.3 10*3/uL (ref 0.1–0.9)
MONO%: 6.3 % (ref 0.0–14.0)
NEUT#: 3 10*3/uL (ref 1.5–6.5)
NEUT%: 64 % (ref 39.0–75.0)
PLATELETS: 202 10*3/uL (ref 140–400)
RBC: 4.73 10*6/uL (ref 4.20–5.82)
RDW: 15.2 % — ABNORMAL HIGH (ref 11.0–14.6)
Retic %: 1.43 % (ref 0.80–1.80)
Retic Ct Abs: 67.64 10*3/uL (ref 34.80–93.90)
WBC: 4.6 10*3/uL (ref 4.0–10.3)
lymph#: 1.4 10*3/uL (ref 0.9–3.3)

## 2015-07-20 LAB — FERRITIN: Ferritin: 108 ng/ml (ref 22–316)

## 2015-07-20 LAB — LACTATE DEHYDROGENASE: LDH: 163 U/L (ref 125–245)

## 2015-07-21 LAB — D-DIMER, QUANTITATIVE (NOT AT ARMC): D-DIMER: 0.42 mg{FEU}/L (ref 0.00–0.49)

## 2015-07-21 LAB — GAMMA GT: GGT: 43 IU/L (ref 0–65)

## 2015-07-21 LAB — PSA: Prostate Specific Ag, Serum: 6.4 ng/mL — ABNORMAL HIGH (ref 0.0–4.0)

## 2015-07-21 LAB — SEDIMENTATION RATE: SED RATE: 9 mm/h (ref 0–30)

## 2015-07-21 LAB — PROTEIN C, TOTAL: Protein C Antigen: 99 % (ref 60–150)

## 2015-07-22 LAB — PROTEIN S ACTIVITY: Protein S-Functional: 112 % (ref 63–140)

## 2015-07-22 LAB — BETA-2-GLYCOPROTEIN I ABS, IGG/M/A

## 2015-07-22 LAB — PROTEIN S, TOTAL: Protein S, Total: 144 % (ref 60–150)

## 2015-07-22 LAB — ANTITHROMBIN III: Antithrombin Activity: 137 % — ABNORMAL HIGH (ref 75–135)

## 2015-07-22 LAB — CARDIOLIPIN ANTIBODIES, IGG, IGM, IGA
ANTICARDIOLIPIN IGM: 9 [MPL'U]/mL (ref 0–12)
Anticardiolipin Ab,IgA,Qn: <9 APL U/mL (ref 0–11)
Anticardiolipin Ab,IgG,Qn: <9 GPL U/mL (ref 0–14)

## 2015-07-22 LAB — PROTEIN C ACTIVITY: Protein C-Functional: 155 % (ref 73–180)

## 2015-07-23 LAB — LUPUS ANTICOAGULANT PANEL
DRVVT CONFIRM: 1.5 ratio — AB (ref 0.8–1.2)
DRVVT MIX: 55.3 s — AB (ref 0.0–44.0)
PTT-LA: 37.9 s (ref 0.0–43.6)
dRVVT: 73.9 s — ABNORMAL HIGH (ref 0.0–44.0)

## 2015-07-24 ENCOUNTER — Telehealth: Payer: Self-pay | Admitting: Hematology

## 2015-07-24 NOTE — Telephone Encounter (Signed)
cld & spoke to pt and gave pt time & date of appt for 6/30@11 :30

## 2015-07-25 LAB — MULTIPLE MYELOMA PANEL, SERUM
ALBUMIN SERPL ELPH-MCNC: 3.9 g/dL (ref 2.9–4.4)
ALPHA2 GLOB SERPL ELPH-MCNC: 0.8 g/dL (ref 0.4–1.0)
Albumin/Glob SerPl: 1.3 (ref 0.7–1.7)
Alpha 1: 0.3 g/dL (ref 0.0–0.4)
B-GLOBULIN SERPL ELPH-MCNC: 1.1 g/dL (ref 0.7–1.3)
GAMMA GLOB SERPL ELPH-MCNC: 1 g/dL (ref 0.4–1.8)
GLOBULIN, TOTAL: 3.2 g/dL (ref 2.2–3.9)
IgA, Qn, Serum: 140 mg/dL (ref 61–437)
IgM, Qn, Serum: 106 mg/dL (ref 20–172)
TOTAL PROTEIN: 7.1 g/dL (ref 6.0–8.5)

## 2015-07-25 LAB — FACTOR 5 LEIDEN

## 2015-07-25 LAB — PROTHROMBIN GENE MUTATION

## 2015-07-27 ENCOUNTER — Ambulatory Visit (HOSPITAL_COMMUNITY): Payer: BLUE CROSS/BLUE SHIELD

## 2015-08-01 ENCOUNTER — Telehealth: Payer: Self-pay | Admitting: Hematology

## 2015-08-01 NOTE — Telephone Encounter (Signed)
per pt to CX appt-Kale already gave him results-will come in June

## 2015-08-02 ENCOUNTER — Ambulatory Visit: Payer: BLUE CROSS/BLUE SHIELD | Admitting: Hematology

## 2015-08-08 ENCOUNTER — Ambulatory Visit: Payer: BLUE CROSS/BLUE SHIELD | Admitting: Gastroenterology

## 2015-10-24 ENCOUNTER — Ambulatory Visit: Payer: BLUE CROSS/BLUE SHIELD | Admitting: Hematology

## 2015-10-24 ENCOUNTER — Other Ambulatory Visit: Payer: BLUE CROSS/BLUE SHIELD

## 2015-11-02 ENCOUNTER — Other Ambulatory Visit (HOSPITAL_BASED_OUTPATIENT_CLINIC_OR_DEPARTMENT_OTHER): Payer: BLUE CROSS/BLUE SHIELD

## 2015-11-02 ENCOUNTER — Ambulatory Visit: Payer: BLUE CROSS/BLUE SHIELD | Admitting: Hematology

## 2015-11-02 DIAGNOSIS — R760 Raised antibody titer: Secondary | ICD-10-CM

## 2015-11-02 DIAGNOSIS — D6489 Other specified anemias: Secondary | ICD-10-CM | POA: Diagnosis not present

## 2015-11-02 DIAGNOSIS — I82402 Acute embolism and thrombosis of unspecified deep veins of left lower extremity: Secondary | ICD-10-CM

## 2015-11-02 LAB — CBC & DIFF AND RETIC
BASO%: 0.4 % (ref 0.0–2.0)
Basophils Absolute: 0 10*3/uL (ref 0.0–0.1)
EOS ABS: 0.1 10*3/uL (ref 0.0–0.5)
EOS%: 1.3 % (ref 0.0–7.0)
HCT: 39.3 % (ref 38.4–49.9)
HEMOGLOBIN: 13.9 g/dL (ref 13.0–17.1)
Immature Retic Fract: 2 % — ABNORMAL LOW (ref 3.00–10.60)
LYMPH%: 22.1 % (ref 14.0–49.0)
MCH: 29.2 pg (ref 27.2–33.4)
MCHC: 35.4 g/dL (ref 32.0–36.0)
MCV: 82.6 fL (ref 79.3–98.0)
MONO#: 0.6 10*3/uL (ref 0.1–0.9)
MONO%: 11.8 % (ref 0.0–14.0)
NEUT%: 64.4 % (ref 39.0–75.0)
NEUTROS ABS: 3.5 10*3/uL (ref 1.5–6.5)
Platelets: 172 10*3/uL (ref 140–400)
RBC: 4.76 10*6/uL (ref 4.20–5.82)
RDW: 14.4 % (ref 11.0–14.6)
RETIC %: 1.8 % (ref 0.80–1.80)
Retic Ct Abs: 85.68 10*3/uL (ref 34.80–93.90)
WBC: 5.4 10*3/uL (ref 4.0–10.3)
lymph#: 1.2 10*3/uL (ref 0.9–3.3)

## 2015-11-02 LAB — COMPREHENSIVE METABOLIC PANEL
ALBUMIN: 3.9 g/dL (ref 3.5–5.0)
ALT: 25 U/L (ref 0–55)
AST: 20 U/L (ref 5–34)
Alkaline Phosphatase: 87 U/L (ref 40–150)
Anion Gap: 8 mEq/L (ref 3–11)
BUN: 16.7 mg/dL (ref 7.0–26.0)
CHLORIDE: 106 meq/L (ref 98–109)
CO2: 26 meq/L (ref 22–29)
Calcium: 9.5 mg/dL (ref 8.4–10.4)
Creatinine: 0.8 mg/dL (ref 0.7–1.3)
EGFR: >90 mL/min/{1.73_m2} (ref >90)
GLUCOSE: 102 mg/dL (ref 70–140)
POTASSIUM: 4.3 meq/L (ref 3.5–5.1)
SODIUM: 140 meq/L (ref 136–145)
Total Bilirubin: 0.49 mg/dL (ref 0.20–1.20)
Total Protein: 7.4 g/dL (ref 6.4–8.3)

## 2015-11-07 LAB — LUPUS ANTICOAGULANT PANEL
DRVVT CONFIRM: 1.8 ratio — AB (ref 0.8–1.2)
DRVVT: 97.9 s — AB (ref 0.0–47.0)
PTT-LA: 46.9 s (ref 0.0–51.9)
dRVVT Mix: 64.5 s — ABNORMAL HIGH (ref 0.0–47.0)

## 2015-11-15 ENCOUNTER — Telehealth: Payer: Self-pay | Admitting: Hematology

## 2015-11-15 ENCOUNTER — Other Ambulatory Visit: Payer: BLUE CROSS/BLUE SHIELD

## 2015-11-15 ENCOUNTER — Other Ambulatory Visit: Payer: Self-pay | Admitting: *Deleted

## 2015-11-15 DIAGNOSIS — I82402 Acute embolism and thrombosis of unspecified deep veins of left lower extremity: Secondary | ICD-10-CM

## 2015-11-15 NOTE — Telephone Encounter (Signed)
cld & spoke to pt and gave pt time & date of appt °

## 2015-11-16 ENCOUNTER — Encounter: Payer: Self-pay | Admitting: Hematology

## 2015-11-16 ENCOUNTER — Other Ambulatory Visit (HOSPITAL_BASED_OUTPATIENT_CLINIC_OR_DEPARTMENT_OTHER): Payer: BLUE CROSS/BLUE SHIELD

## 2015-11-16 ENCOUNTER — Ambulatory Visit (HOSPITAL_BASED_OUTPATIENT_CLINIC_OR_DEPARTMENT_OTHER): Payer: BLUE CROSS/BLUE SHIELD | Admitting: Hematology

## 2015-11-16 VITALS — BP 128/62 | HR 63 | Temp 97.7°F | Resp 18 | Ht 72.0 in | Wt 202.9 lb

## 2015-11-16 DIAGNOSIS — I82412 Acute embolism and thrombosis of left femoral vein: Secondary | ICD-10-CM

## 2015-11-16 DIAGNOSIS — D6489 Other specified anemias: Secondary | ICD-10-CM

## 2015-11-16 DIAGNOSIS — I82402 Acute embolism and thrombosis of unspecified deep veins of left lower extremity: Secondary | ICD-10-CM | POA: Diagnosis not present

## 2015-11-16 DIAGNOSIS — I8012 Phlebitis and thrombophlebitis of left femoral vein: Secondary | ICD-10-CM | POA: Diagnosis not present

## 2015-11-16 DIAGNOSIS — R790 Abnormal level of blood mineral: Secondary | ICD-10-CM

## 2015-11-16 DIAGNOSIS — R760 Raised antibody titer: Secondary | ICD-10-CM

## 2015-11-16 LAB — COMPREHENSIVE METABOLIC PANEL
ALT: 24 U/L (ref 0–55)
ANION GAP: 9 meq/L (ref 3–11)
AST: 19 U/L (ref 5–34)
Albumin: 3.9 g/dL (ref 3.5–5.0)
Alkaline Phosphatase: 90 U/L (ref 40–150)
BUN: 19.4 mg/dL (ref 7.0–26.0)
CALCIUM: 9.3 mg/dL (ref 8.4–10.4)
CHLORIDE: 108 meq/L (ref 98–109)
CO2: 24 mEq/L (ref 22–29)
Creatinine: 0.9 mg/dL (ref 0.7–1.3)
EGFR: >90 mL/min/{1.73_m2} (ref >90)
Glucose: 106 mg/dl (ref 70–140)
POTASSIUM: 4.1 meq/L (ref 3.5–5.1)
Sodium: 141 mEq/L (ref 136–145)
Total Bilirubin: 0.45 mg/dL (ref 0.20–1.20)
Total Protein: 7.3 g/dL (ref 6.4–8.3)

## 2015-11-16 LAB — CBC WITH DIFFERENTIAL/PLATELET
BASO%: 0.5 % (ref 0.0–2.0)
BASOS ABS: 0 10*3/uL (ref 0.0–0.1)
EOS%: 1.4 % (ref 0.0–7.0)
Eosinophils Absolute: 0.1 10*3/uL (ref 0.0–0.5)
HEMATOCRIT: 41.3 % (ref 38.4–49.9)
HGB: 14.1 g/dL (ref 13.0–17.1)
LYMPH#: 1.3 10*3/uL (ref 0.9–3.3)
LYMPH%: 23.5 % (ref 14.0–49.0)
MCH: 28.7 pg (ref 27.2–33.4)
MCHC: 34.1 g/dL (ref 32.0–36.0)
MCV: 84.2 fL (ref 79.3–98.0)
MONO#: 0.4 10*3/uL (ref 0.1–0.9)
MONO%: 7.8 % (ref 0.0–14.0)
NEUT#: 3.8 10*3/uL (ref 1.5–6.5)
NEUT%: 66.8 % (ref 39.0–75.0)
PLATELETS: 213 10*3/uL (ref 140–400)
RBC: 4.91 10*6/uL (ref 4.20–5.82)
RDW: 15 % — ABNORMAL HIGH (ref 11.0–14.6)
WBC: 5.6 10*3/uL (ref 4.0–10.3)

## 2015-11-29 NOTE — Progress Notes (Signed)
Marland Kitchen    HEMATOLOGY/ONCOLOGY CONSULTATION NOTE  Date of Service: .11/16/2015  Patient Care Team: Velna Hatchet, MD as PCP - General (Internal Medicine)  CHIEF COMPLAINTS/PURPOSE OF CONSULTATION:  Acute left lower extremity DVT -unprovoked   HISTORY OF PRESENTING ILLNESS:   Barry Rice is a wonderful 65 y.o. male who has been referred to [place] by Dr Ardeth Perfect for evaluation and management of unprovoked left lower extremity DVT.  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 Patient has generally been in good health and in mid January after having been crouching down doing some DIY work developed acute onset left lower extremity swelling and discomfort. Patient reports he presented to the emergency room on 05/20/2015 and was diagnosed as having a left groin strain. Patient subsequently presented to his primary care physician and had an ultrasound of his left lower extremity on 05/25/2015 that showed an acute DVT throughout the left lower extremity with superficial thrombus in the left proximal greater saphenous vein. Patient was placed on Rivaroxaban on 05/25/2015. He was admitted to the hospital and had A Catheter Directed Left Lower Extremity thrombolysis and thrombectomy with near complete resolution of ilio-femoral DVT. He was briefly placed on IV heparin while he was in the hospital and was discharged home eventually on Rivaroxaban.  Patient notes that he has been taking his Rivaroxaban regularly and his left lower extremity swelling is much improved. Has been using a compression sock and notes that it keeps is swelling down significantly.  Patient was next smoker but quit about 27 years ago. No previous personal history of venous thromboembolism or arterial thrombosis. One of his brothers was a smoker had a history of DVT and was treated with warfarin. Father had phlebitis.  Patient reports no  recent immobilization. Last treated to the [place] was in August 2016. He has had a history of a left inguinal hernia repair with mesh about 2 years ago but reports that no specific scar tissue or venous compression was noted that was thought to be obstructing to venous flow.  He has been referred to [place] for evaluation of a possible hypercoagulable state.  INTERVAL HISTORY  [name] is here for f/u regarding his extensive unprovoked lower extremity DVT. He has completed just over 6 months of anticoagulation. Notes no significant left lower extremity swelling at this time. Continues to use compression socks. We discussed that his repeat lupus anticoagulant test was positive as well and this was more than 3 months apart. We discussed that the Xarelto could potentially affect the test and in an ideal world we would like to have this done without the patient on anticoagulation. However given his extensive venous thrombotic event which was unprovoked we would recommend long-term anticoagulation even if this testing were negative. Patient understands this and notes that he would likely choose to be on long-term anticoagulation at this point.  MEDICAL HISTORY:  Past Medical History:  Diagnosis Date  . Arthritis   . Depression   . Hernia, inguinal   . Restless leg syndrome   Elevated PSA Ex-smoker Chronic mid back pain from arthritis thought to be degenerative has been multiple NSAIDs. He was diagnosed as having HLA-B27 associated seronegative arthritis.  SURGICAL HISTORY: Past Surgical History:  Procedure Laterality Date  . HERNIA REPAIR    . TRIGGER FINGER RELEASE    . WISDOM TOOTH EXTRACTION    Left lower extremity DVT catheter directed thrombolysis  in January 2017   SOCIAL HISTORY: Social History   Social History  . Marital status: Married    Spouse name: N/A  . Number of children: N/A  . Years of education: N/A   Occupational History  . Not on file.   Social History Main Topics  .  Smoking status: Former Smoker    Quit date: 07/19/1995  . Smokeless tobacco: Not on file  . Alcohol use Yes     Comment: 1 beer a night  . Drug use: No  . Sexual activity: Not on file   Other Topics Concern  . Not on file   Social History Narrative  . No narrative on file    FAMILY HISTORY: Family History  Problem Relation Age of Onset  . Heart disease Mother   . Arthritis Mother   . Hypertension Father   . Stroke Father     ALLERGIES:  has No Known Allergies.  MEDICATIONS:  Current Outpatient Prescriptions  Medication Sig Dispense Refill  . PARoxetine (PAXIL) 20 MG tablet Take 10 mg by mouth daily. Reported on 05/31/2015    . rOPINIRole (REQUIP) 0.5 MG tablet Take 0.5 mg by mouth at bedtime. Reported on 05/31/2015    . XARELTO 20 MG TABS tablet Take 20 mg by mouth every morning. Reported on 07/05/2015     No current facility-administered medications for this visit.     REVIEW OF SYSTEMS:    10 Point review of Systems was done is negative except as noted above.  PHYSICAL EXAMINATION: ECOG PERFORMANCE STATUS: 1 - Symptomatic but completely ambulatory  . Vitals:   11/16/15 1133  BP: 128/62  Pulse: 63  Resp: 18  Temp: 97.7 F (36.5 C)   Filed Weights   11/16/15 1133  Weight: 202 lb 14.4 oz (92 kg)   .Body mass index is 27.52 kg/m.  GENERAL:alert, in no acute distress and comfortable SKIN: skin color, texture, turgor are normal, no rashes or significant lesions EYES: normal, conjunctiva are pink and non-injected, sclera clear OROPHARYNX:no exudate, no erythema and lips, buccal mucosa, and tongue normal  NECK: supple, no JVD, thyroid normal size, non-tender, without nodularity LYMPH:  no palpable lymphadenopathy in the cervical, axillary or inguinal LUNGS: clear to auscultation with normal respiratory effort HEART: regular rate & rhythm,  no murmur ABDOMEN: abdomen soft, non-tender, normoactive bowel sounds  Musculoskeletal: no cyanosis of digits and no  clubbing , Minimal left lower extremity swelling with compression sock on. PSYCH: alert & oriented x 3 with fluent speech NEURO: no focal motor/sensory deficits  LABORATORY DATA:  I have reviewed the data as listed  . CBC Latest Ref Rng & Units 11/16/2015 11/02/2015 07/20/2015  WBC 4.0 - 10.3 10e3/uL 5.6 5.4 4.6  Hemoglobin 13.0 - 17.1 g/dL 14.1 13.9 13.7  Hematocrit 38.4 - 49.9 % 41.3 39.3 40.2  Platelets 140 - 400 10e3/uL 213 172 202    . CMP Latest Ref Rng & Units 11/16/2015 11/02/2015 07/20/2015  Glucose 70 - 140 mg/dl 106 102 93  BUN 7.0 - 26.0 mg/dL 19.4 16.7 16.5  Creatinine 0.7 - 1.3 mg/dL 0.9 0.8 0.9  Sodium 136 - 145 mEq/L 141 140 142  Potassium 3.5 - 5.1 mEq/L 4.1 4.3 4.1  Chloride 101 - 111 mmol/L - - -  CO2 22 - 29 mEq/L 24 26 24   Calcium 8.4 - 10.4 mg/dL 9.3 9.5 10.1  Total Protein 6.4 - 8.3 g/dL 7.3 7.4 7.7  Total Bilirubin 0.20 - 1.20 mg/dL 0.45 0.49 0.43  Alkaline Phos 40 - 150 U/L 90 87 81  AST 5 - 34 U/L 19 20 24   ALT 0 - 55 U/L 24 25 32   Component     Latest Ref Rng 07/20/2015  Sodium     136 - 145 mEq/L 142  Potassium     3.5 - 5.1 mEq/L 4.1  Chloride     98 - 109 mEq/L 108  CO2     22 - 29 mEq/L 24  Glucose     70 - 140 mg/dl 93  BUN     7.0 - 26.0 mg/dL 16.5  Creatinine     0.7 - 1.3 mg/dL 0.9  Total Bilirubin     0.20 - 1.20 mg/dL 0.43  Alkaline Phosphatase     40 - 150 U/L 81  AST     5 - 34 U/L 24  ALT     0 - 55 U/L 32  Total Protein     6.4 - 8.3 g/dL 7.7  Albumin     3.5 - 5.0 g/dL 4.1  Calcium     8.4 - 10.4 mg/dL 10.1  Anion gap     3 - 11 mEq/L 11  EGFR     >90 ml/min/1.73 m2 >90  PTT-LA     0.0 - 43.6 sec 37.9  DRVVT     0.0 - 44.0 sec 73.9 (H)  dRVVT Mix     0.0 - 44.0 sec 55.3 (H)  dRVVT Confirm     0.8 - 1.2 ratio 1.5 (H)  Lupus Anticoag Interp      Comment:  Anticardiolipin Ab,IgG,Qn     0 - 14 GPL U/mL <9  Anticardiolipin Ab,IgM,Qn     0 - 12 MPL U/mL 9  Anticardiolipin Ab,IgA,Qn     0 - 11 APL U/mL <9    Beta-2 Glycoprotein I Ab, IgG     0 - 20 GPI IgG units <9  Beta-2 Glyco 1 IgA     0 - 25 GPI IgA units <9  Beta-2 Glyco 1 IgM     0 - 32 GPI IgM units <9  Sed Rate     0 - 30 mm/hr 9  Ferritin     22 - 316 ng/ml 108  D-dimer     0.00 - 0.49 mg/L FEU 0.42  LDH     125 - 245 U/L 163  PSA     0.0 - 4.0 ng/mL 6.4 (H)  Antithrombin Activity     75 - 135 % 137 (H)  Protein C Antigen     60 - 150 % 99  Protein C-Functional     73 - 180 % 155  Protein S-Functional     63 - 140 % 112  Protein S, Total     60 - 150 % 144   Lupus anticoagulant panel      No reference range information available      Comments:           Results are consistent with the presence of a lupus            Anticoagulant.    RADIOGRAPHIC STUDIES: I have personally reviewed the radiological images as listed and agreed with the findings in the report. No results found.  US venous 05/25/2015:      ASSESSMENT & PLAN:   65 year old Caucasian gentleman with  #1 Extensive left lower extremity unprovoked DVT in January 2017. Patient had left sided Ilio-femoral DVT extending  into the leg and underwent catheter directed thrombolysis and thrombectomy by interventional radiology with significant improvement in symptoms. Patient has currently completed a little more than 6 months of anticoagulation with Xarelto and his left lower extremity symptoms are much better. He continues to his compression socks. His d-dimer levels have normalized.  He had a thrombophilia workup which showed presence of a lupus anticoagulant. Repeat labs after 3 months showed persistence of a lupus anticoagulant Anticardiolipin antibody negative, beta-2 glycoprotein antibody negative. Factor V Leiden mutation and prothrombin gene mutation- negative Protein C and protein S levels within normal limits  CBC today shows no signs of a myeloproliferative neoplasm. No focal symptoms suggestive of malignancy at this time. LDH within normal  limits. Myeloma panel pending - would be low yield. Was sent because of anemia. Her anemia has now resolved.  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 This could be present possibly in association with his HLA B27 positive seronegative arthritis. I discussed with the patient that sometimes the Xarelto can cause a false-positive result and ideally we should repeat the test when the patient is off Xarelto for at least 2-3 days. Plan -Patient has persistent lupus anticoagulant repeated 3 months apart with the caveat that the test was done while he was on Xarelto and this can sometimes cause false positives. Patient understands this. -We discussed that we could confirm his lupus anticoagulant by repeating the test with him being off Xarelto for at least 2-3 days. However this might not change the recommendation to be on long-term anticoagulation given that he had an extensive unprovoked blood clot with either lupus anticoagulant as a risk factor or some other on detectable risk factor that could be ongoing. -We discussed the primary recommendation to continue Rivaroxaban long-term. Other options provided were nor in the future switching to prophylactic doses of Rivaroxaban are aspirin though ongoing therapy to anticoagulation with Rivaroxaban would probably be preferred. -Patient chooses to continue to be on Rivaroxaban long-term at this time. -He will continue to follow-up with his primary care physician for monitoring his ongoing Xarelto. Would need labs every 6 months to make sure his liver and kidney function remains unchanged. -Follow-up with primary care physician for age-appropriate cancer screening. -Follow-up with urologist for workup of elevated PSA. -Patient is due for his colonoscopy - notes that he has never had one. -Reconsult [place] if any new questions or treatment changes decisions are contemplated.  #2 Elevated PSA at  6.4 -Patient is being worked up by his urologist.  #3 Anemia -normocytic normochromic anemia with hemoglobin of 10.1 noted in January 2017. Likely related to blood loss with his catheter directed thrombolytic procedure. His hemoglobin today has normalized and is 13.7 with a normal MCV of 85. Ferritin level within normal limits. LDH normal.  #4 abnormal liver function tests noted in January 2017. ALT was noted to be 113, AST was 103, alkaline phosphatase 245. Repeat LFTs today are within normal limits. Was likely related to his thrombolytic therapy.  #5? 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 -Was using NSAIDs when necessary. -He has been counseled to avoid NSAIDs alongside anticoagulation due to increased risk of bleeding. -Would recommend rheumatology evaluation. We'll do further referral to his primary care physician for ongoing care coordination.  All of the patients and his wife's  questions were answereto their apparent  satisfaction. The patient knows to call the clinic with any problems, questions or concerns.  I spent 35 minutes counseling the  patient face to face. The total time spent in the appointment was 40 minutes and more than 50% was on counseling the patient regarding details of the natural history diagnosis and prognosis of lupus anticoagulant /antiphospholipid antibody and treatment options /strategy for recurrent DVT prevention and direct patient cares.    Sullivan Lone MD [place] AAHIVMS [facility] Hematology/Oncology Physician [facility]  (Office):       [phone] (Work cell):  [phone] (Fax):           [phone]

## 2016-05-08 ENCOUNTER — Encounter: Payer: Self-pay | Admitting: Gastroenterology

## 2016-06-16 ENCOUNTER — Encounter: Payer: Self-pay | Admitting: Gastroenterology

## 2016-06-16 ENCOUNTER — Telehealth: Payer: Self-pay

## 2016-06-16 ENCOUNTER — Ambulatory Visit (INDEPENDENT_AMBULATORY_CARE_PROVIDER_SITE_OTHER): Payer: BLUE CROSS/BLUE SHIELD | Admitting: Gastroenterology

## 2016-06-16 VITALS — BP 120/60 | HR 93 | Ht 73.0 in | Wt 204.0 lb

## 2016-06-16 DIAGNOSIS — R1013 Epigastric pain: Secondary | ICD-10-CM

## 2016-06-16 DIAGNOSIS — Z1211 Encounter for screening for malignant neoplasm of colon: Secondary | ICD-10-CM

## 2016-06-16 DIAGNOSIS — G8929 Other chronic pain: Secondary | ICD-10-CM | POA: Diagnosis not present

## 2016-06-16 MED ORDER — NA SULFATE-K SULFATE-MG SULF 17.5-3.13-1.6 GM/177ML PO SOLN: 1.0000 | Freq: Once | ORAL | 0 refills | Status: DC

## 2016-06-16 MED ORDER — NA SULFATE-K SULFATE-MG SULF 17.5-3.13-1.6 GM/177ML PO SOLN: 1.0000 | Freq: Once | ORAL | 0 refills | Status: AC

## 2016-06-16 NOTE — Patient Instructions (Signed)
If you are age 65 or older, your body mass index should be between 23-30. Your Body mass index is 26.91 kg/m. If this is out of the aforementioned range listed, please consider follow up with your Primary Care Provider.  If you are age 65 or younger, your body mass index should be between 19-25. Your Body mass index is 26.91 kg/m. If this is out of the aformentioned range listed, please consider follow up with your Primary Care Provider.   You have been scheduled for an endoscopy and colonoscopy. Please follow the written instructions given to you at your visit today. Please pick up your prep supplies at the pharmacy within the next 1-3 days. If you use inhalers (even only as needed), please bring them with you on the day of your procedure. Your physician has requested that you go to www.startemmi.com and enter the access code given to you at your visit today. This web site gives a general overview about your procedure. However, you should still follow specific instructions given to you by our office regarding your preparation for the procedure.  Thank you for choosing me and [facility].  Dr. Ellwood DenseH. Danis

## 2016-06-16 NOTE — Progress Notes (Signed)
[facility] Consult Note:  History: Barry Rice 06/16/2016  Referring physician: Velna Hatchet, MD  Reason for consult/chief complaint: Abdominal Pain (pt reports epigstric pain that he attributes to years of NSAID use; states he has to be careful what he eats as his stomach is easily irritated) and Colonoscopy (patient has never had a colonoscopy)   Subjective  HPI:  This is a 66 year old man referred by primary care for persistent dyspepsia. He reports years of intermittent epigastric pain that seems worse with NSAID use. He was taking those meds for chronic arthritic symptoms, but stopped because it was causing this pain. He reports an upper endoscopy decades ago. He recalls there being some "irritation" and does not know if he was tested for H. pylori. Stanford has been on Xarelto for one year since an extensive left leg DVT it required surgical thrombectomy. His last visit with Dr.Kale of hematology was in July 2017, not office note was reviewed. Barry Rice apparently tested positive for a lupus anticoagulant antibody, and will be on [place] indefinitely. He denies black tarry stool by red blood per rectum, change in bowel habits, nausea, vomiting, dysphagia or weight loss. The gastric discomfort is central, nonradiating, seems worse on an empty stomach, and may hurt if he presses on it. He denies a family history of esophagitis gastric or colon cancer, and he has not previously had a screening colonoscopy.  ROS:  Review of Systems  Constitutional: Negative for appetite change and unexpected weight change.  HENT: Negative for mouth sores and voice change.   Eyes: Negative for pain and redness.  Respiratory: Negative for cough and shortness of breath.   Cardiovascular: Negative for chest pain and palpitations.  Genitourinary: Negative for dysuria and hematuria.  Musculoskeletal: Positive for arthralgias. Negative for myalgias.  Skin: Negative for pallor and rash.  Neurological:  Negative for weakness and headaches.  Hematological: Negative for adenopathy.     Past Medical History: Past Medical History:  Diagnosis Date  . Arthritis   . Depression   . DVT (deep venous thrombosis) (HCC)    LLE DVT, extensive 05-2015  . Elevated PSA   . Hernia, inguinal   . Restless leg syndrome      Past Surgical History: Past Surgical History:  Procedure Laterality Date  . HERNIA REPAIR    . THROMBECTOMY    . TRIGGER FINGER RELEASE    . WISDOM TOOTH EXTRACTION       Family History: Family History  Problem Relation Age of Onset  . Heart disease Mother   . Arthritis Mother   . Hypertension Father   . Stroke Father     Social History: Social History   Social History  . Marital status: Married    Spouse name: N/A  . Number of children: N/A  . Years of education: N/A   Social History Main Topics  . Smoking status: Former Smoker    Types: Cigarettes    Quit date: 07/19/1995  . Smokeless tobacco: None  . Alcohol use Yes     Comment: 1 beer a night  . Drug use: No  . Sexual activity: Not Asked   Other Topics Concern  . None   Social History Narrative  . None  Originally from the [place], Retired from the [employer]vertising account planner, he and his wife will soon be traveling the country.  Allergies: No Known Allergies  Outpatient Meds: Current Outpatient Prescriptions  Medication Sig Dispense Refill  . omeprazole (PRILOSEC) 20 MG capsule Take 20  mg by mouth daily.    Marland Kitchen PARoxetine (PAXIL) 20 MG tablet Take 10 mg by mouth daily. Reported on 05/31/2015    . pramipexole (MIRAPEX) 0.125 MG tablet Take 0.125 mg by mouth 3 (three) times daily.    Alveda Reasons 20 MG TABS tablet Take 20 mg by mouth every morning. Reported on 07/05/2015    . Na Sulfate-K Sulfate-Mg Sulf 17.5-3.13-1.6 GM/180ML SOLN Take 1 kit by mouth once. 354 mL 0   No current facility-administered medications for this visit.        ___________________________________________________________________ Objective   Exam:  BP 120/60   Pulse 93   Ht 6' 1" (1.854 m)   Wt 204 lb (92.5 kg)   BMI 26.91 kg/m    General: this is a(n) Well-appearing middle-aged man   Eyes: sclera anicteric, no redness  ENT: oral mucosa moist without lesions, no cervical or supraclavicular lymphadenopathy, good dentition  CV: RRR without murmur, S1/S2, no JVD, no peripheral edema  Resp: clear to auscultation bilaterally, normal RR and effort noted  GI: soft, no tenderness, with active bowel sounds. No guarding or palpable organomegaly noted.  Skin; warm and dry, no rash or jaundice noted  Neuro: awake, alert and oriented x 3. Normal gross motor function and fluent speech  Labs:  CBC last month normal   Assessment: Encounter Diagnoses  Name Primary?  . Abdominal pain, chronic, epigastric Yes  . Special screening for malignant neoplasms, colon   Upper endoscopy plan to evaluate chronic epigastric pain. He is understandably concerned about the possibility of peptic ulcer since he requires chronic OAC. He also needs a screening colonoscopy.  Plan:  EGD and colonoscopy  The benefits and risks of the planned procedure were described in detail with the patient or (when appropriate) their health care proxy.  Risks were outlined as including, but not limited to, bleeding, infection, perforation, adverse medication reaction leading to cardiac or pulmonary decompensation, or pancreatitis (if ERCP).  The limitation of incomplete mucosal visualization was also discussed.  No guarantees or warranties were given.   Even if ulcers not discovered, it seems unlikely he will want to go back on NSAIDs since they upset his stomach. I then urged him to talk with the primary care about other therapies. Perhaps a mild narcotic such as Tylenol No. 3 would be reasonable.  Thank you for the courtesy of this consult.  Please call me with any  questions or concerns.  Nelida Meuse III  CC: Velna Hatchet, MD

## 2016-06-16 NOTE — Telephone Encounter (Signed)
  RE: Carmelia Rollereter Behrman DOB: 09-Aug-1950 MRN: [id number]   Dear Candise CheKale,    We have scheduled the above patient for an endoscopic procedure. Our records show that he is on anticoagulation therapy.   Please advise as to how long the patient may come off his therapy of Xarelto prior to the procedure, which is scheduled for 07/11/16.  Please fax back to Elon Spanneroni Todd  at [phone].   Sincerely,    Priscella MannGloria Miesha Bachmann , RMA

## 2016-06-27 ENCOUNTER — Encounter: Payer: Self-pay | Admitting: Gastroenterology

## 2016-07-03 NOTE — Telephone Encounter (Addendum)
Dr Dr Kirtland BouchardK. Estrellita LudwigG. Kale,  Can you please give clearance to hold Xarelto prior to patients scheduled endoscopic procedure.   Lanny Hursthank-you,   Winton Offord, CMA

## 2016-07-07 ENCOUNTER — Telehealth: Payer: Self-pay | Admitting: Gastroenterology

## 2016-07-07 NOTE — Telephone Encounter (Signed)
Patient returning Barry Rice's call about holding BT for appt on 07-11-16

## 2016-07-07 NOTE — Telephone Encounter (Signed)
Phone called routed to Barry Spanoni T. as she is working on this with his provider.

## 2016-07-07 NOTE — Telephone Encounter (Signed)
Pt aware we are still waiting to hear from his primary.

## 2016-07-07 NOTE — Telephone Encounter (Signed)
Dr Jorene Minorskales office contacted via phone, clearance must be obtained with the pts primary physician. Pt contacted. [facility] Placed a call to that office to get medical clearance to hold Xarelto. Procedure is scheduled for 07-11-2016.

## 2016-07-08 NOTE — Telephone Encounter (Signed)
[facility]

## 2016-07-08 NOTE — Telephone Encounter (Signed)
Pt notified and aware of instructions to hold his Xarelto. He states clear understanding.

## 2016-07-08 NOTE — Telephone Encounter (Signed)
Dr Link SnufferHolwerda personally returned my call. He sates the pt is a high risk pt for coming off his Xarelto. He recommends that pt stay on the Xarelto until the day of the procedure. He said to have the pt take the Xarelto Thursday morning and then resume the same day,after the colonoscopys is complete.

## 2016-07-10 ENCOUNTER — Other Ambulatory Visit (HOSPITAL_COMMUNITY): Payer: Self-pay | Admitting: Urology

## 2016-07-10 DIAGNOSIS — R972 Elevated prostate specific antigen [PSA]: Secondary | ICD-10-CM

## 2016-07-11 ENCOUNTER — Ambulatory Visit (AMBULATORY_SURGERY_CENTER): Payer: BLUE CROSS/BLUE SHIELD | Admitting: Gastroenterology

## 2016-07-11 ENCOUNTER — Encounter: Payer: Self-pay | Admitting: Gastroenterology

## 2016-07-11 VITALS — BP 123/62 | HR 66 | Temp 97.3°F | Resp 16 | Ht 73.0 in | Wt 204.0 lb

## 2016-07-11 DIAGNOSIS — R1013 Epigastric pain: Secondary | ICD-10-CM

## 2016-07-11 DIAGNOSIS — Z1211 Encounter for screening for malignant neoplasm of colon: Secondary | ICD-10-CM

## 2016-07-11 DIAGNOSIS — Z1212 Encounter for screening for malignant neoplasm of rectum: Secondary | ICD-10-CM

## 2016-07-11 MED ORDER — SODIUM CHLORIDE 0.9 % IV SOLN: 500.0000 mL | INTRAVENOUS | Status: AC

## 2016-07-11 NOTE — Op Note (Signed)
[facility] Patient Name: Barry Rice Procedure Date: 07/11/2016 2:54 PM MRN: [id number] Endoscopist: Sherilyn CooterHenry L. Myrtie Neitheranis , MD Age: [age] Referring MD:  Date of Birth: 01/20/1951 Gender: Male Account #: [id number] Procedure:                Upper GI endoscopy Indications:              Epigastric abdominal pain Medicines:                Monitored Anesthesia Care Procedure:                Pre-Anesthesia Assessment:                           - Prior to the procedure, a History and Physical                            was performed, and patient medications and                            allergies were reviewed. The patient's tolerance of                            previous anesthesia was also reviewed. The risks                            and benefits of the procedure and the sedation                            options and risks were discussed with the patient.                            All questions were answered, and informed consent                            was obtained. Prior Anticoagulants: The patient has                            taken Xarelto (rivaroxaban), last dose was 1 day                            prior to procedure. ASA Grade Assessment: III - A                            patient with severe systemic disease. After                            reviewing the risks and benefits, the patient was                            deemed in satisfactory condition to undergo the                            procedure.  After obtaining informed consent, the endoscope was                            passed under direct vision. Throughout the                            procedure, the patient's blood pressure, pulse, and                            oxygen saturations were monitored continuously. The                            Model [id number]-[id number]) scope was introduced                            through the mouth, and advanced to the second part                             of duodenum. The upper GI endoscopy was                            accomplished without difficulty. The patient                            tolerated the procedure well. Scope In: Scope Out: Findings:                 The larynx was normal.                           The esophagus was normal.                           The entire examined stomach was normal. Biopsies                            were taken with a cold forceps for histology.                           The cardia and gastric fundus were normal on                            retroflexion.                           The examined duodenum was normal. Complications:            No immediate complications. Estimated Blood Loss:     Estimated blood loss: none. Impression:               - Normal larynx.                           - Normal esophagus.                           - Normal stomach. Biopsied.                           -  Normal examined duodenum.                           Non-ulcer dyspepsia intermittently exacerbated by                            NSAID use. Recommendation:           - Patient has a contact number available for                            emergencies. The signs and symptoms of potential                            delayed complications were discussed with the                            patient. Return to normal activities tomorrow.                            Written discharge instructions were provided to the                            patient.                           - Resume previous diet.                           - Continue present medications.                           - Await pathology results.                           - See the other procedure note for documentation of                            additional recommendations.                           - Resume Xarelto (rivaroxaban) at prior dose today. Henry L. Myrtie Neither, MD 07/11/2016 3:27:29 PM This report has been signed electronically.

## 2016-07-11 NOTE — Patient Instructions (Signed)
Impression/Recommendations:  Resume Xarelto at prior dose today.  Repeat colonoscopy in 10 years for screening purposes.  YOU HAD AN ENDOSCOPIC PROCEDURE TODAY AT THE [facility]:   Refer to the procedure report that was given to you for any specific questions about what was found during the examination.  If the procedure report does not answer your questions, please call your gastroenterologist to clarify.  If you requested that your care partner not be given the details of your procedure findings, then the procedure report has been included in a sealed envelope for you to review at your convenience later.  YOU SHOULD EXPECT: Some feelings of bloating in the abdomen. Passage of more gas than usual.  Walking can help get rid of the air that was put into your GI tract during the procedure and reduce the bloating. If you had a lower endoscopy (such as a colonoscopy or flexible sigmoidoscopy) you may notice spotting of blood in your stool or on the toilet paper. If you underwent a bowel prep for your procedure, you may not have a normal bowel movement for a few days.  Please Note:  You might notice some irritation and congestion in your nose or some drainage.  This is from the oxygen used during your procedure.  There is no need for concern and it should clear up in a day or so.  SYMPTOMS TO REPORT IMMEDIATELY:   Following lower endoscopy (colonoscopy or flexible sigmoidoscopy):  Excessive amounts of blood in the stool  Significant tenderness or worsening of abdominal pains  Swelling of the abdomen that is new, acute  Fever of 100F or higher   Following upper endoscopy (EGD)  Vomiting of blood or coffee ground material  New chest pain or pain under the shoulder blades  Painful or persistently difficult swallowing  New shortness of breath  Fever of 100F or higher  Black, tarry-looking stools  For urgent or emergent issues, a gastroenterologist can be reached at any hour by  calling [phone].   DIET:  We do recommend a small meal at first, but then you may proceed to your regular diet.  Drink plenty of fluids but you should avoid alcoholic beverages for 24 hours.  ACTIVITY:  You should plan to take it easy for the rest of today and you should NOT DRIVE or use heavy machinery until tomorrow (because of the sedation medicines used during the test).    FOLLOW UP: Our staff will call the number listed on your records the next business day following your procedure to check on you and address any questions or concerns that you may have regarding the information given to you following your procedure. If we do not reach you, we will leave a message.  However, if you are feeling well and you are not experiencing any problems, there is no need to return our call.  We will assume that you have returned to your regular daily activities without incident.  If any biopsies were taken you will be contacted by phone or by letter within the next 1-3 weeks.  Please call us at [phone] if you have not heard about the biopsies in 3 weeks.    SIGNATURES/CONFIDENTIALITY: You and/or your care partner have signed paperwork which will be entered into your electronic medical record.  These signatures attest to the fact that that the information above on your After Visit Summary has been reviewed and is understood.  Full responsibility of the confidentiality of this discharge information  lies with you and/or your care-partner.

## 2016-07-11 NOTE — Op Note (Signed)
[facility] Patient Name: Barry Rice Procedure Date: 07/11/2016 2:55 PM MRN: [id number] Endoscopist: Sherilyn Cooter L. Myrtie Neither , MD Age: 66 Referring MD:  Date of Birth: 1951-03-24 Gender: Male Account #: [id number] Procedure:                Colonoscopy Indications:              Screening for colorectal malignant neoplasm, This                            is the patient's first colonoscopy Medicines:                Monitored Anesthesia Care Procedure:                Pre-Anesthesia Assessment:                           - Prior to the procedure, a History and Physical                            was performed, and patient medications and                            allergies were reviewed. The patient's tolerance of                            previous anesthesia was also reviewed. The risks                            and benefits of the procedure and the sedation                            options and risks were discussed with the patient.                            All questions were answered, and informed consent                            was obtained. Prior Anticoagulants: The patient has                            taken Xarelto (rivaroxaban), last dose was 1 day                            prior to procedure. ASA Grade Assessment: III - A                            patient with severe systemic disease. After                            reviewing the risks and benefits, the patient was                            deemed in satisfactory condition to undergo the  procedure.                           - Prior to the procedure, a History and Physical                            was performed, and patient medications and                            allergies were reviewed. The patient's tolerance of                            previous anesthesia was also reviewed. The risks                            and benefits of the procedure and the sedation     options and risks were discussed with the patient.                            All questions were answered, and informed consent                            was obtained. Prior Anticoagulants: The patient has                            taken Xarelto (rivaroxaban), last dose was 1 day                            prior to procedure. ASA Grade Assessment: III - A                            patient with severe systemic disease. After                            reviewing the risks and benefits, the patient was                            deemed in satisfactory condition to undergo the                            procedure.                           After obtaining informed consent, the colonoscope                            was passed under direct vision. Throughout the                            procedure, the patient's blood pressure, pulse, and                            oxygen saturations were monitored continuously. The  Colonoscope was introduced through the anus and                            advanced to the the cecum, identified by                            appendiceal orifice and ileocecal valve. The                            colonoscopy was performed without difficulty. The                            patient tolerated the procedure well. The quality                            of the bowel preparation was good. The ileocecal                            valve, appendiceal orifice, and rectum were                            photographed. Scope In: 3:10:25 PM Scope Out: 3:22:35 PM Scope Withdrawal Time: 0 hours 8 minutes 25 seconds  Total Procedure Duration: 0 hours 12 minutes 10 seconds  Findings:                 The perianal and digital rectal examinations were                            normal.                           The entire examined colon appeared normal on direct                            and retroflexion views. Complications:            No immediate  complications. Estimated Blood Loss:     Estimated blood loss: none. Impression:               - The entire examined colon is normal on direct and                            retroflexion views.                           - No specimens collected. Recommendation:           - Patient has a contact number available for                            emergencies. The signs and symptoms of potential                            delayed complications were discussed with the  patient. Return to normal activities tomorrow.                            Written discharge instructions were provided to the                            patient.                           - Resume previous diet.                           - Resume Xarelto (rivaroxaban) at prior dose today.                           - Repeat colonoscopy in 10 years for screening                            purposes. Henry L. Myrtie Neitheranis, MD 07/11/2016 3:29:07 PM This report has been signed electronically.

## 2016-07-11 NOTE — Progress Notes (Signed)
Patient awakening,vss,report to rn 

## 2016-07-11 NOTE — Progress Notes (Signed)
Called to room to assist during endoscopic procedure.  Patient ID and intended procedure confirmed with present staff. Received instructions for my participation in the procedure from the performing physician.  

## 2016-07-14 ENCOUNTER — Telehealth: Payer: Self-pay | Admitting: *Deleted

## 2016-07-14 NOTE — Telephone Encounter (Signed)
  Follow up Call-  Call back number 07/11/2016  Post procedure Call Back phone  # [phone]  Permission to leave phone message Yes     Patient questions:  Do you have a fever, pain , or abdominal swelling? No. Pain Score  0 *  Have you tolerated food without any problems? Yes.    Have you been able to return to your normal activities? Yes.    Do you have any questions about your discharge instructions: Diet   No. Medications  No. Follow up visit  No.  Do you have questions or concerns about your Care? No.  Actions: * If pain score is 4 or above: No action needed, pain <4.

## 2016-07-17 ENCOUNTER — Encounter: Payer: Self-pay | Admitting: Gastroenterology

## 2016-07-30 ENCOUNTER — Ambulatory Visit (HOSPITAL_COMMUNITY)
Admission: RE | Admit: 2016-07-30 | Discharge: 2016-07-30 | Disposition: A | Discharge status: Home / Self Care | Payer: BLUE CROSS/BLUE SHIELD | Source: Ambulatory Visit | Attending: Urology | Admitting: Urology

## 2016-07-30 DIAGNOSIS — R972 Elevated prostate specific antigen [PSA]: Secondary | ICD-10-CM | POA: Diagnosis present

## 2016-07-30 LAB — POCT I-STAT CREATININE: Creatinine, Ser: 0.8 mg/dL (ref 0.61–1.24)

## 2016-07-30 MED ORDER — LIDOCAINE HCL 2 % EX GEL: 1.0000 "application " | Freq: Once | CUTANEOUS | Status: DC

## 2016-07-30 MED ORDER — LIDOCAINE HCL 2 % EX GEL
CUTANEOUS | Status: AC
  Filled 2016-07-30: qty 30

## 2016-07-30 MED ORDER — GADOBENATE DIMEGLUMINE 529 MG/ML IV SOLN
20.0000 mL | Freq: Once | INTRAVENOUS | Status: AC | PRN
Start: 2016-07-30 — End: 2016-07-30
  Administered 2016-07-30: 20 mL via INTRAVENOUS

## 2017-05-06 DIAGNOSIS — Z0000 Encounter for general adult medical examination without abnormal findings: Secondary | ICD-10-CM | POA: Diagnosis not present

## 2017-05-06 DIAGNOSIS — Z125 Encounter for screening for malignant neoplasm of prostate: Secondary | ICD-10-CM | POA: Diagnosis not present

## 2017-05-12 DIAGNOSIS — Z7901 Long term (current) use of anticoagulants: Secondary | ICD-10-CM | POA: Diagnosis not present

## 2017-05-12 DIAGNOSIS — Z0000 Encounter for general adult medical examination without abnormal findings: Secondary | ICD-10-CM | POA: Diagnosis not present

## 2017-05-12 DIAGNOSIS — K219 Gastro-esophageal reflux disease without esophagitis: Secondary | ICD-10-CM | POA: Diagnosis not present

## 2017-05-12 DIAGNOSIS — R972 Elevated prostate specific antigen [PSA]: Secondary | ICD-10-CM | POA: Diagnosis not present

## 2017-05-12 DIAGNOSIS — B351 Tinea unguium: Secondary | ICD-10-CM | POA: Diagnosis not present

## 2017-05-12 DIAGNOSIS — Z1389 Encounter for screening for other disorder: Secondary | ICD-10-CM | POA: Diagnosis not present

## 2017-05-18 DIAGNOSIS — Z1212 Encounter for screening for malignant neoplasm of rectum: Secondary | ICD-10-CM | POA: Diagnosis not present

## 2017-07-16 IMAGING — XA IR THROMBECTOMY MECHANICAL PRIMARY ADD SAME FAMILY
11 of 14 series · 13 of 24 positions shown · IV contrast (IODINE)
Comparison: 06/01/2015

INDICATION: Acute symptomatic left ileal femoral and popliteal DVT, 20 hours
status post thrombo lysis, subsequent encounter.

EXAM:
IR THROMB F/U EVAL JIM/TAMEZ FINAL DAY; THROMBECTOMY MECHANICAL ;
RADIOLOGY EXAMINATION
TECHNIQUE: Informed written consent was obtained from the patient after a
thorough discussion of the procedural risks, benefits and
alternatives. All questions were addressed. Maximal Sterile Barrier
Technique was utilized including caps, mask, sterile gowns, sterile
gloves, sterile drape, hand hygiene and skin antiseptic. A timeout
was performed prior to the initiation of the procedure.

[Series 11: fl (-) angio · 2 acquisitions, 1 frame shown (1 of 3)]
[im 1/2]
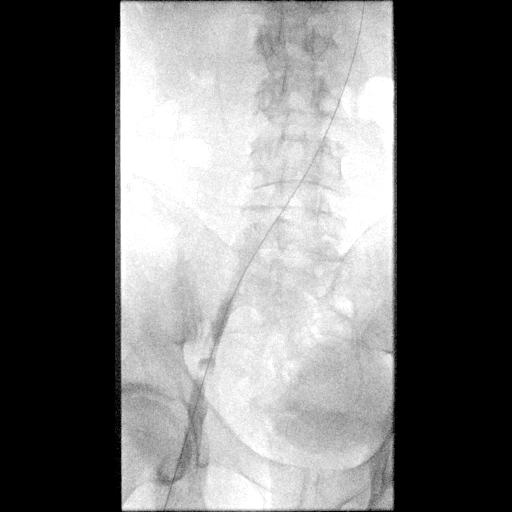

[Series 12: fl (-) angio · 2 acquisitions, 1 frame shown (2 of 3)]
[im 1/2]
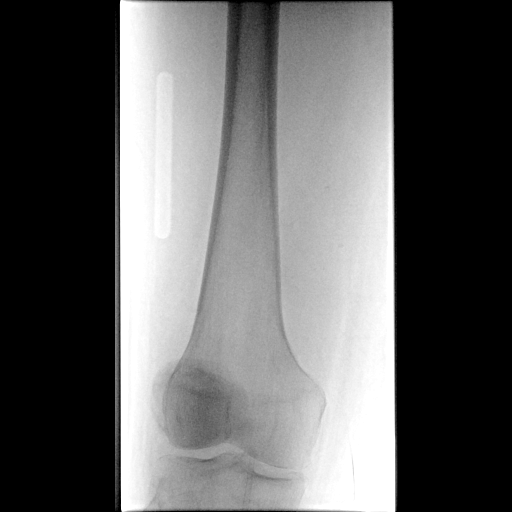

[Series 13: fl (-) angio · 2 acquisitions, 1 frame shown (3 of 3)]
[im 1/2]
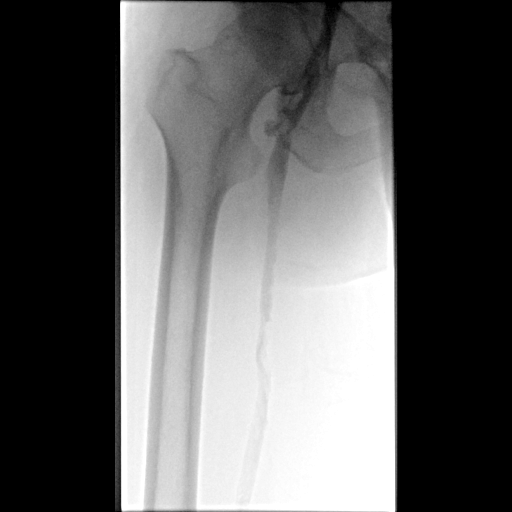

[Series 15: body 4 care · 1 of 29 frames shown (1 of 7)]
[frame 5/29]
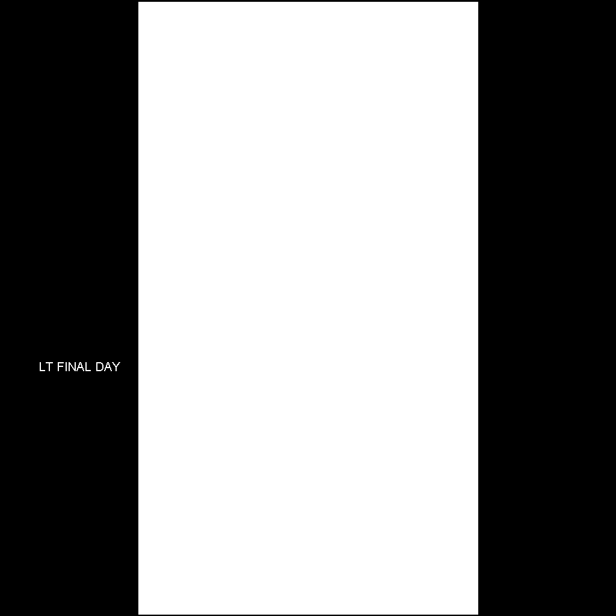

[Series 16: body 4 care · 1 of 33 frames shown (2 of 7)]
[frame 17/33]
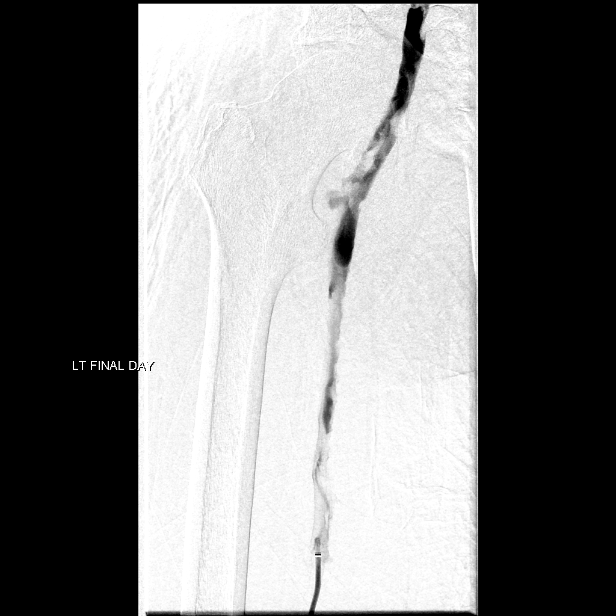

[Series 18: body 4 care · 1 of 36 frames shown (3 of 7)]
[frame 31/36]
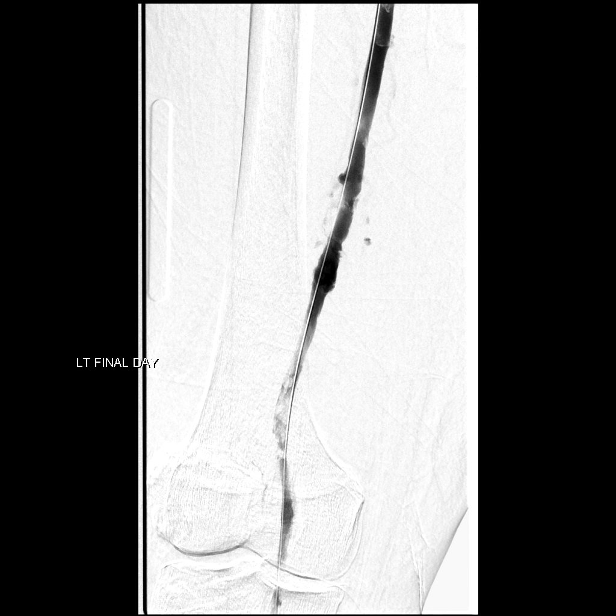

[Series 21: body 4 care · 2 of 34 frames shown (4 of 7)]
[frame 6/34]
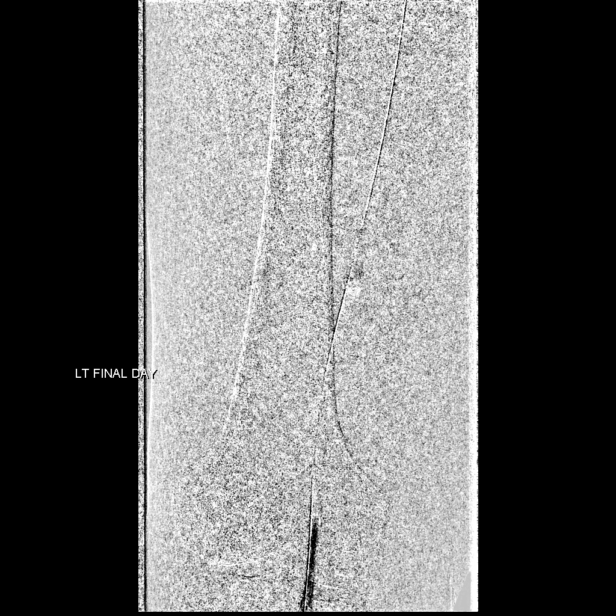
[frame 29/34]
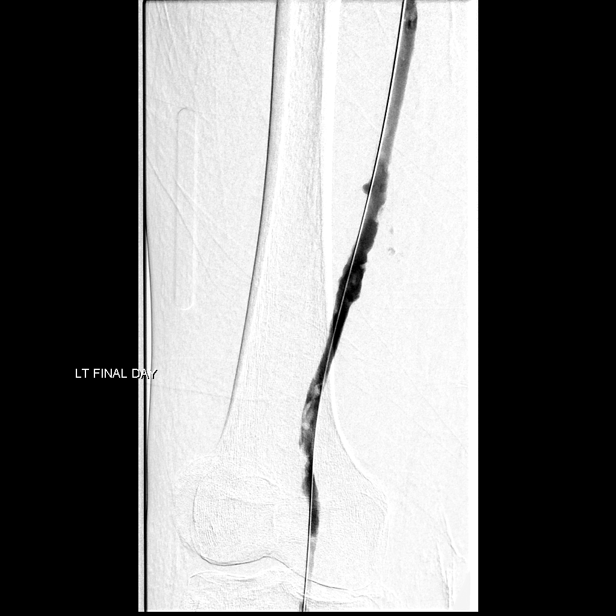

[Series 23: body 4 care · 1 of 61 frames shown (5 of 7)]
[frame 10/61]
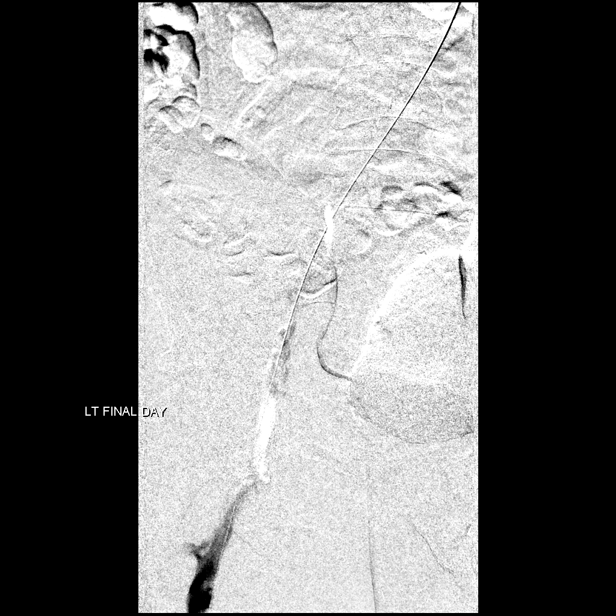

[Series 25: body 4 care · 1 of 57 frames shown (6 of 7)]
[frame 36/57]
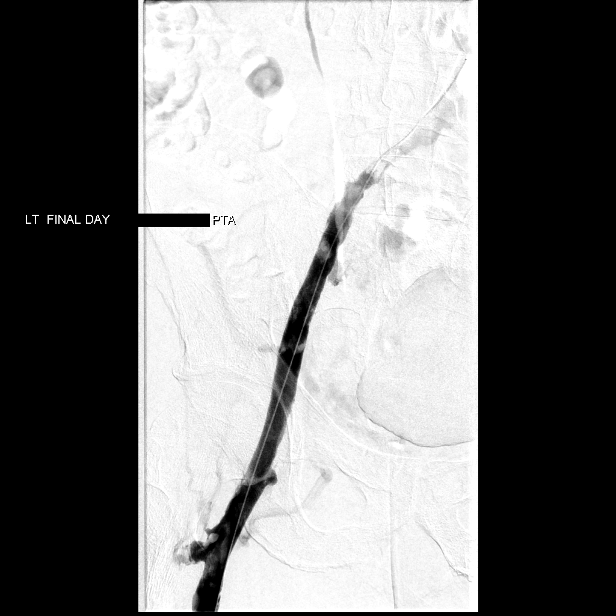

[Series 27: body 4 care · 1 of 26 frames shown (7 of 7)]
[frame 23/26]
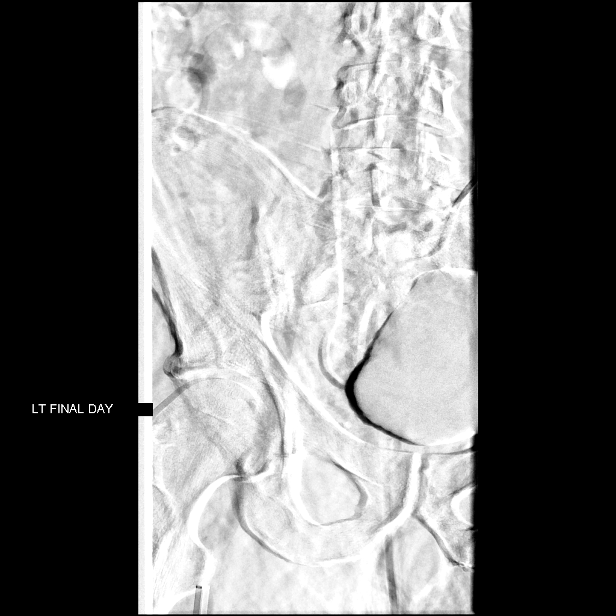

[Series 300: ir thromb f/u eval art/ven subseq day (m · 2 of 20 slices shown]
[im 10/20]
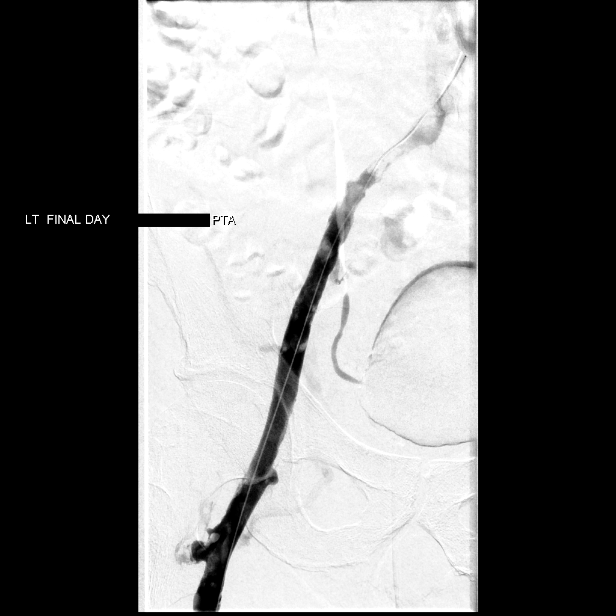
[im 20/20]
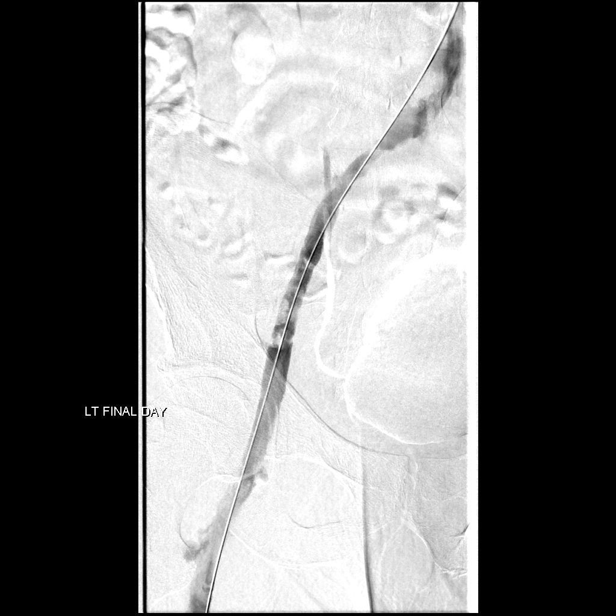

[13 of 24 positions shown; findings below may reference images not displayed]

MEDICATIONS:
1% lidocaine locally

ANESTHESIA/SEDATION:
Versed 4 mg IV; Fentanyl 200 mcg IV, 4 mg morphine

Moderate Sedation Time:  90

The patient was continuously monitored during the procedure by the
interventional radiology nurse under my direct supervision.

FLUOROSCOPY TIME:  Fluoroscopy Time: 24 minutes 42 seconds (35 mGy).

COMPLICATIONS:
None immediate.
Under sterile conditions and local anesthesia, left lower extremity
follow-up venogram performed through the existing popliteal sheath
and the infusion catheter.

Follow-up left lower extremity venogram: Following thrombo lysis,
there is improvement in the left iliac, femoral popliteal occlusive
DVT. Residual thrombus with stagnant flow is present from the iliac
bifurcation to the knee. Areas of iliac and femoral venous narrowing
noted as well.

Mechanical thrombectomy: Over an Amplatz guidewire, the infusion
catheter was removed. Sheath was exchanged for an 8 French sheath.
Through the access, the Indigo CAT 8 suction thrombectomy catheter
was passed several times throughout the iliac and femoral residual
thrombus. A moderate volume of thrombus was able to be removed via
the suction mechanical thrombectomy device. Approximately 350 cc
blood volume aspirated during the mechanical thrombectomy. Follow-up
venogram demonstrated continued improvement in the thrombus burden
throughout the iliac and femoral segments. Scattered areas of
adherent clot remain with partial luminal narrowing and persistent
venous stasis.

Venous angioplasty: For additional chronic adherent clot maceration
and treatment of areas of residual venous stenosis, overlapping
serial angioplasty was performed throughout the iliac and femoral
veins initially with a 10 mm balloon. There are areas of persistent
luminal narrowing. Additional overlapping angioplasty performed with
a 12 mm and a 14 mm balloon. Following extensive serial overlapping
angioplasty, final venograms are performed.

Final venogram: following overnight thrombo lysis, mechanical
thrombectomy, and serial 10 -14 mm overlapping angioplasty, there is
a scattered mild amount of residual chronic adherent thrombus and
areas of mild luminal narrowing but no significant residual
occlusive thrombus or venous stenosis. Upon injection, there is
restoration of the iliofemoral venous outflow channel into the IVC.
No residual flow limiting abnormality.

Access removed. Hemostasis obtained with compression. Patient
tolerated the procedure well.
FINDINGS: Mild overnight improvement in iliofemoral occlusive thrombus. ALBER
thrombo lysis had to be stopped because of a very low fibrinogen
measuring 60. mechanical thrombectomy and serial balloon angioplasty
performed throughout the iliac and femoral veins to restore flow as
detailed above.
IMPRESSION: Successful fluoroscopic left lower extremity DVT mechanical
thrombectomy and serial venous balloon angioplasty to restore
antegrade flow within the iliac and femoral veins.

## 2017-08-10 DIAGNOSIS — F329 Major depressive disorder, single episode, unspecified: Secondary | ICD-10-CM | POA: Diagnosis not present

## 2017-08-10 DIAGNOSIS — N529 Male erectile dysfunction, unspecified: Secondary | ICD-10-CM | POA: Diagnosis not present

## 2017-08-10 DIAGNOSIS — G2581 Restless legs syndrome: Secondary | ICD-10-CM | POA: Diagnosis not present

## 2017-08-10 DIAGNOSIS — B351 Tinea unguium: Secondary | ICD-10-CM | POA: Diagnosis not present

## 2018-01-30 DIAGNOSIS — Z23 Encounter for immunization: Secondary | ICD-10-CM | POA: Diagnosis not present

## 2018-05-07 DIAGNOSIS — Z125 Encounter for screening for malignant neoplasm of prostate: Secondary | ICD-10-CM | POA: Diagnosis not present

## 2018-05-07 DIAGNOSIS — R82998 Other abnormal findings in urine: Secondary | ICD-10-CM | POA: Diagnosis not present

## 2018-05-07 DIAGNOSIS — Z0000 Encounter for general adult medical examination without abnormal findings: Secondary | ICD-10-CM | POA: Diagnosis not present

## 2018-05-07 DIAGNOSIS — R5383 Other fatigue: Secondary | ICD-10-CM | POA: Diagnosis not present

## 2018-05-13 DIAGNOSIS — Z1212 Encounter for screening for malignant neoplasm of rectum: Secondary | ICD-10-CM | POA: Diagnosis not present

## 2018-05-14 DIAGNOSIS — B351 Tinea unguium: Secondary | ICD-10-CM | POA: Diagnosis not present

## 2018-05-14 DIAGNOSIS — M5414 Radiculopathy, thoracic region: Secondary | ICD-10-CM | POA: Diagnosis not present

## 2018-05-14 DIAGNOSIS — K219 Gastro-esophageal reflux disease without esophagitis: Secondary | ICD-10-CM | POA: Diagnosis not present

## 2018-05-14 DIAGNOSIS — Z23 Encounter for immunization: Secondary | ICD-10-CM | POA: Diagnosis not present

## 2018-05-14 DIAGNOSIS — Z0000 Encounter for general adult medical examination without abnormal findings: Secondary | ICD-10-CM | POA: Diagnosis not present

## 2018-05-14 DIAGNOSIS — Z1389 Encounter for screening for other disorder: Secondary | ICD-10-CM | POA: Diagnosis not present

## 2018-05-14 DIAGNOSIS — E611 Iron deficiency: Secondary | ICD-10-CM | POA: Diagnosis not present

## 2018-05-18 DIAGNOSIS — Z6826 Body mass index (BMI) 26.0-26.9, adult: Secondary | ICD-10-CM | POA: Diagnosis not present

## 2018-05-18 DIAGNOSIS — M5489 Other dorsalgia: Secondary | ICD-10-CM | POA: Diagnosis not present

## 2018-05-18 DIAGNOSIS — R030 Elevated blood-pressure reading, without diagnosis of hypertension: Secondary | ICD-10-CM | POA: Diagnosis not present

## 2018-05-25 DIAGNOSIS — M4804 Spinal stenosis, thoracic region: Secondary | ICD-10-CM | POA: Diagnosis not present

## 2018-05-26 DIAGNOSIS — M5489 Other dorsalgia: Secondary | ICD-10-CM | POA: Diagnosis not present

## 2018-06-01 DIAGNOSIS — M5489 Other dorsalgia: Secondary | ICD-10-CM | POA: Diagnosis not present

## 2018-06-21 DIAGNOSIS — M5489 Other dorsalgia: Secondary | ICD-10-CM | POA: Diagnosis not present

## 2018-06-29 DIAGNOSIS — M5489 Other dorsalgia: Secondary | ICD-10-CM | POA: Diagnosis not present

## 2018-10-05 DIAGNOSIS — I10 Essential (primary) hypertension: Secondary | ICD-10-CM | POA: Diagnosis not present

## 2018-10-05 DIAGNOSIS — Z6826 Body mass index (BMI) 26.0-26.9, adult: Secondary | ICD-10-CM | POA: Diagnosis not present

## 2018-10-05 DIAGNOSIS — M7918 Myalgia, other site: Secondary | ICD-10-CM | POA: Diagnosis not present

## 2018-11-22 DIAGNOSIS — M7918 Myalgia, other site: Secondary | ICD-10-CM | POA: Diagnosis not present

## 2018-12-02 DIAGNOSIS — R972 Elevated prostate specific antigen [PSA]: Secondary | ICD-10-CM | POA: Diagnosis not present

## 2018-12-02 DIAGNOSIS — I82502 Chronic embolism and thrombosis of unspecified deep veins of left lower extremity: Secondary | ICD-10-CM | POA: Diagnosis not present

## 2018-12-14 DIAGNOSIS — M4802 Spinal stenosis, cervical region: Secondary | ICD-10-CM | POA: Diagnosis not present

## 2018-12-24 DIAGNOSIS — R972 Elevated prostate specific antigen [PSA]: Secondary | ICD-10-CM | POA: Diagnosis not present

## 2019-01-06 DIAGNOSIS — C61 Malignant neoplasm of prostate: Secondary | ICD-10-CM | POA: Diagnosis not present

## 2019-01-06 DIAGNOSIS — D075 Carcinoma in situ of prostate: Secondary | ICD-10-CM | POA: Diagnosis not present

## 2019-01-06 DIAGNOSIS — R972 Elevated prostate specific antigen [PSA]: Secondary | ICD-10-CM | POA: Diagnosis not present

## 2019-01-13 DIAGNOSIS — C61 Malignant neoplasm of prostate: Secondary | ICD-10-CM | POA: Diagnosis not present

## 2019-01-25 DIAGNOSIS — M4802 Spinal stenosis, cervical region: Secondary | ICD-10-CM | POA: Diagnosis not present

## 2019-01-28 DIAGNOSIS — Z23 Encounter for immunization: Secondary | ICD-10-CM | POA: Diagnosis not present

## 2019-02-24 DIAGNOSIS — Z6826 Body mass index (BMI) 26.0-26.9, adult: Secondary | ICD-10-CM | POA: Diagnosis not present

## 2019-02-24 DIAGNOSIS — M4802 Spinal stenosis, cervical region: Secondary | ICD-10-CM | POA: Diagnosis not present

## 2019-02-24 DIAGNOSIS — R030 Elevated blood-pressure reading, without diagnosis of hypertension: Secondary | ICD-10-CM | POA: Diagnosis not present

## 2019-04-26 DIAGNOSIS — R030 Elevated blood-pressure reading, without diagnosis of hypertension: Secondary | ICD-10-CM | POA: Diagnosis not present

## 2019-04-26 DIAGNOSIS — M7918 Myalgia, other site: Secondary | ICD-10-CM | POA: Diagnosis not present

## 2019-04-26 DIAGNOSIS — Z6827 Body mass index (BMI) 27.0-27.9, adult: Secondary | ICD-10-CM | POA: Diagnosis not present

## 2019-05-25 DIAGNOSIS — E611 Iron deficiency: Secondary | ICD-10-CM | POA: Diagnosis not present

## 2019-05-25 DIAGNOSIS — Z125 Encounter for screening for malignant neoplasm of prostate: Secondary | ICD-10-CM | POA: Diagnosis not present

## 2019-05-25 DIAGNOSIS — Z0000 Encounter for general adult medical examination without abnormal findings: Secondary | ICD-10-CM | POA: Diagnosis not present

## 2019-06-01 DIAGNOSIS — F330 Major depressive disorder, recurrent, mild: Secondary | ICD-10-CM | POA: Diagnosis not present

## 2019-06-01 DIAGNOSIS — Z7901 Long term (current) use of anticoagulants: Secondary | ICD-10-CM | POA: Diagnosis not present

## 2019-06-01 DIAGNOSIS — M5489 Other dorsalgia: Secondary | ICD-10-CM | POA: Diagnosis not present

## 2019-06-01 DIAGNOSIS — Z1331 Encounter for screening for depression: Secondary | ICD-10-CM | POA: Diagnosis not present

## 2019-06-01 DIAGNOSIS — R972 Elevated prostate specific antigen [PSA]: Secondary | ICD-10-CM | POA: Diagnosis not present

## 2019-06-01 DIAGNOSIS — Z0000 Encounter for general adult medical examination without abnormal findings: Secondary | ICD-10-CM | POA: Diagnosis not present

## 2019-06-12 ENCOUNTER — Ambulatory Visit: Payer: Self-pay | Attending: Internal Medicine

## 2019-06-12 DIAGNOSIS — Z23 Encounter for immunization: Secondary | ICD-10-CM | POA: Insufficient documentation

## 2019-06-12 NOTE — Progress Notes (Signed)
   Covid-19 Vaccination Clinic  Name:  Barry Rice    MRN: [id number] DOB: [birth year]  06/12/2019  [name] was observed post Covid-19 immunization for 15 minutes without incidence. He was provided with Vaccine Information Sheet and instruction to access the V-Safe system.   [name] was instructed to call 911 with any severe reactions post vaccine: Marland Kitchen Difficulty breathing  . Swelling of your face and throat  . A fast heartbeat  . A bad rash all over your body  . Dizziness and weakness    Immunizations Administered    Name Date Dose VIS Date Route   Pfizer COVID-19 Vaccine 06/12/2019  1:56 PM 0.3 mL 04/15/2019 Intramuscular   Manufacturer: [employer], [employer]   Lot: [id number]   NDC: [address]-3

## 2019-06-30 ENCOUNTER — Ambulatory Visit: Payer: BLUE CROSS/BLUE SHIELD

## 2019-07-07 ENCOUNTER — Ambulatory Visit: Payer: Self-pay | Attending: Internal Medicine

## 2019-07-07 DIAGNOSIS — Z23 Encounter for immunization: Secondary | ICD-10-CM | POA: Insufficient documentation

## 2019-07-07 NOTE — Progress Notes (Signed)
   Covid-19 Vaccination Clinic  Name:  Tyliek Timberman    MRN: [id number] DOB: [birth year]  07/07/2019  Mr. Hodkinson was observed post Covid-19 immunization for 15 minutes without incident. He was provided with Vaccine Information Sheet and instruction to access the V-Safe system.   Mr. Glotfelty was instructed to call 911 with any severe reactions post vaccine: Marland Kitchen Difficulty breathing  . Swelling of face and throat  . A fast heartbeat  . A bad rash all over body  . Dizziness and weakness   Immunizations Administered    Name Date Dose VIS Date Route   Pfizer COVID-19 Vaccine 07/07/2019  8:56 AM 0.3 mL 04/15/2019 Intramuscular   Manufacturer: [employer], [employer]   Lot: [id number]   NDC: [address]-6

## 2019-10-10 DIAGNOSIS — M545 Low back pain: Secondary | ICD-10-CM | POA: Diagnosis not present

## 2019-10-12 DIAGNOSIS — M545 Low back pain: Secondary | ICD-10-CM | POA: Diagnosis not present

## 2019-10-24 DIAGNOSIS — M545 Low back pain: Secondary | ICD-10-CM | POA: Diagnosis not present

## 2019-10-27 DIAGNOSIS — M545 Low back pain: Secondary | ICD-10-CM | POA: Diagnosis not present

## 2019-11-04 DIAGNOSIS — M545 Low back pain: Secondary | ICD-10-CM | POA: Diagnosis not present

## 2019-11-10 DIAGNOSIS — M545 Low back pain: Secondary | ICD-10-CM | POA: Diagnosis not present

## 2020-02-04 DIAGNOSIS — Z23 Encounter for immunization: Secondary | ICD-10-CM | POA: Diagnosis not present

## 2020-02-23 DIAGNOSIS — M545 Low back pain, unspecified: Secondary | ICD-10-CM | POA: Diagnosis not present

## 2020-03-15 DIAGNOSIS — M545 Low back pain, unspecified: Secondary | ICD-10-CM | POA: Diagnosis not present

## 2020-04-02 DIAGNOSIS — M545 Low back pain, unspecified: Secondary | ICD-10-CM | POA: Diagnosis not present

## 2020-04-20 DIAGNOSIS — M545 Low back pain, unspecified: Secondary | ICD-10-CM | POA: Diagnosis not present

## 2020-06-04 DIAGNOSIS — Z0000 Encounter for general adult medical examination without abnormal findings: Secondary | ICD-10-CM | POA: Diagnosis not present

## 2020-06-04 DIAGNOSIS — Z125 Encounter for screening for malignant neoplasm of prostate: Secondary | ICD-10-CM | POA: Diagnosis not present

## 2020-06-04 DIAGNOSIS — R7989 Other specified abnormal findings of blood chemistry: Secondary | ICD-10-CM | POA: Diagnosis not present

## 2020-06-11 DIAGNOSIS — Z1331 Encounter for screening for depression: Secondary | ICD-10-CM | POA: Diagnosis not present

## 2020-06-11 DIAGNOSIS — Z0000 Encounter for general adult medical examination without abnormal findings: Secondary | ICD-10-CM | POA: Diagnosis not present

## 2020-06-11 DIAGNOSIS — Z1212 Encounter for screening for malignant neoplasm of rectum: Secondary | ICD-10-CM | POA: Diagnosis not present

## 2020-06-11 DIAGNOSIS — F330 Major depressive disorder, recurrent, mild: Secondary | ICD-10-CM | POA: Diagnosis not present

## 2020-06-11 DIAGNOSIS — R82998 Other abnormal findings in urine: Secondary | ICD-10-CM | POA: Diagnosis not present

## 2020-06-11 DIAGNOSIS — Z1339 Encounter for screening examination for other mental health and behavioral disorders: Secondary | ICD-10-CM | POA: Diagnosis not present

## 2020-12-12 DIAGNOSIS — Z125 Encounter for screening for malignant neoplasm of prostate: Secondary | ICD-10-CM | POA: Diagnosis not present

## 2021-06-19 DIAGNOSIS — R972 Elevated prostate specific antigen [PSA]: Secondary | ICD-10-CM | POA: Diagnosis not present

## 2021-06-19 DIAGNOSIS — Z79899 Other long term (current) drug therapy: Secondary | ICD-10-CM | POA: Diagnosis not present

## 2021-06-19 DIAGNOSIS — R7989 Other specified abnormal findings of blood chemistry: Secondary | ICD-10-CM | POA: Diagnosis not present

## 2021-06-19 DIAGNOSIS — Z125 Encounter for screening for malignant neoplasm of prostate: Secondary | ICD-10-CM | POA: Diagnosis not present

## 2021-07-23 DIAGNOSIS — Z0000 Encounter for general adult medical examination without abnormal findings: Secondary | ICD-10-CM | POA: Diagnosis not present

## 2021-07-23 DIAGNOSIS — Z1339 Encounter for screening examination for other mental health and behavioral disorders: Secondary | ICD-10-CM | POA: Diagnosis not present

## 2021-07-23 DIAGNOSIS — Z1331 Encounter for screening for depression: Secondary | ICD-10-CM | POA: Diagnosis not present

## 2021-07-23 DIAGNOSIS — R972 Elevated prostate specific antigen [PSA]: Secondary | ICD-10-CM | POA: Diagnosis not present

## 2021-07-23 DIAGNOSIS — D692 Other nonthrombocytopenic purpura: Secondary | ICD-10-CM | POA: Diagnosis not present

## 2021-07-23 DIAGNOSIS — Z87891 Personal history of nicotine dependence: Secondary | ICD-10-CM | POA: Diagnosis not present

## 2021-07-23 DIAGNOSIS — F330 Major depressive disorder, recurrent, mild: Secondary | ICD-10-CM | POA: Diagnosis not present

## 2021-07-23 DIAGNOSIS — D6869 Other thrombophilia: Secondary | ICD-10-CM | POA: Diagnosis not present

## 2021-07-25 ENCOUNTER — Other Ambulatory Visit: Payer: Self-pay | Admitting: Internal Medicine

## 2021-07-25 DIAGNOSIS — Z0000 Encounter for general adult medical examination without abnormal findings: Secondary | ICD-10-CM

## 2021-07-25 DIAGNOSIS — Z87891 Personal history of nicotine dependence: Secondary | ICD-10-CM

## 2021-08-01 ENCOUNTER — Ambulatory Visit
Admission: RE | Admit: 2021-08-01 | Discharge: 2021-08-01 | Disposition: A | Discharge status: Home / Self Care | Payer: Medicare Other | Source: Ambulatory Visit | Attending: Internal Medicine | Admitting: Internal Medicine

## 2021-08-01 DIAGNOSIS — Z87891 Personal history of nicotine dependence: Secondary | ICD-10-CM

## 2021-08-01 DIAGNOSIS — Z0000 Encounter for general adult medical examination without abnormal findings: Secondary | ICD-10-CM

## 2021-08-01 DIAGNOSIS — Z136 Encounter for screening for cardiovascular disorders: Secondary | ICD-10-CM | POA: Diagnosis not present

## 2021-09-03 DIAGNOSIS — H2513 Age-related nuclear cataract, bilateral: Secondary | ICD-10-CM | POA: Diagnosis not present

## 2022-03-25 DIAGNOSIS — M79641 Pain in right hand: Secondary | ICD-10-CM | POA: Diagnosis not present

## 2022-03-25 DIAGNOSIS — M65331 Trigger finger, right middle finger: Secondary | ICD-10-CM | POA: Diagnosis not present

## 2022-04-18 DIAGNOSIS — Z23 Encounter for immunization: Secondary | ICD-10-CM | POA: Diagnosis not present

## 2022-05-07 DIAGNOSIS — M65331 Trigger finger, right middle finger: Secondary | ICD-10-CM | POA: Diagnosis not present

## 2022-08-06 DIAGNOSIS — E611 Iron deficiency: Secondary | ICD-10-CM | POA: Diagnosis not present

## 2022-08-06 DIAGNOSIS — N529 Male erectile dysfunction, unspecified: Secondary | ICD-10-CM | POA: Diagnosis not present

## 2022-08-06 DIAGNOSIS — R972 Elevated prostate specific antigen [PSA]: Secondary | ICD-10-CM | POA: Diagnosis not present

## 2022-08-06 DIAGNOSIS — K219 Gastro-esophageal reflux disease without esophagitis: Secondary | ICD-10-CM | POA: Diagnosis not present

## 2022-08-06 DIAGNOSIS — Z79899 Other long term (current) drug therapy: Secondary | ICD-10-CM | POA: Diagnosis not present

## 2022-08-06 DIAGNOSIS — Z125 Encounter for screening for malignant neoplasm of prostate: Secondary | ICD-10-CM | POA: Diagnosis not present

## 2022-08-13 DIAGNOSIS — Z1331 Encounter for screening for depression: Secondary | ICD-10-CM | POA: Diagnosis not present

## 2022-08-13 DIAGNOSIS — Z0000 Encounter for general adult medical examination without abnormal findings: Secondary | ICD-10-CM | POA: Diagnosis not present

## 2022-08-13 DIAGNOSIS — R972 Elevated prostate specific antigen [PSA]: Secondary | ICD-10-CM | POA: Diagnosis not present

## 2022-08-13 DIAGNOSIS — Z87891 Personal history of nicotine dependence: Secondary | ICD-10-CM | POA: Diagnosis not present

## 2022-08-13 DIAGNOSIS — F330 Major depressive disorder, recurrent, mild: Secondary | ICD-10-CM | POA: Diagnosis not present

## 2022-08-13 DIAGNOSIS — D6869 Other thrombophilia: Secondary | ICD-10-CM | POA: Diagnosis not present

## 2022-08-13 DIAGNOSIS — D692 Other nonthrombocytopenic purpura: Secondary | ICD-10-CM | POA: Diagnosis not present

## 2022-08-13 DIAGNOSIS — G2581 Restless legs syndrome: Secondary | ICD-10-CM | POA: Diagnosis not present

## 2022-08-13 DIAGNOSIS — Z1339 Encounter for screening examination for other mental health and behavioral disorders: Secondary | ICD-10-CM | POA: Diagnosis not present

## 2022-12-16 DIAGNOSIS — R972 Elevated prostate specific antigen [PSA]: Secondary | ICD-10-CM | POA: Diagnosis not present

## 2022-12-16 DIAGNOSIS — Z87891 Personal history of nicotine dependence: Secondary | ICD-10-CM | POA: Diagnosis not present

## 2022-12-16 DIAGNOSIS — N5089 Other specified disorders of the male genital organs: Secondary | ICD-10-CM | POA: Diagnosis not present

## 2022-12-22 DIAGNOSIS — N5089 Other specified disorders of the male genital organs: Secondary | ICD-10-CM | POA: Diagnosis not present

## 2023-01-01 DIAGNOSIS — N401 Enlarged prostate with lower urinary tract symptoms: Secondary | ICD-10-CM | POA: Diagnosis not present

## 2023-01-01 DIAGNOSIS — R351 Nocturia: Secondary | ICD-10-CM | POA: Diagnosis not present

## 2023-01-01 DIAGNOSIS — R972 Elevated prostate specific antigen [PSA]: Secondary | ICD-10-CM | POA: Diagnosis not present

## 2023-01-21 DIAGNOSIS — R972 Elevated prostate specific antigen [PSA]: Secondary | ICD-10-CM | POA: Diagnosis not present

## 2023-01-21 DIAGNOSIS — N400 Enlarged prostate without lower urinary tract symptoms: Secondary | ICD-10-CM | POA: Diagnosis not present

## 2023-01-21 DIAGNOSIS — N4289 Other specified disorders of prostate: Secondary | ICD-10-CM | POA: Diagnosis not present

## 2023-02-10 DIAGNOSIS — H2513 Age-related nuclear cataract, bilateral: Secondary | ICD-10-CM | POA: Diagnosis not present

## 2023-03-28 DIAGNOSIS — Z23 Encounter for immunization: Secondary | ICD-10-CM | POA: Diagnosis not present

## 2023-07-09 DIAGNOSIS — R972 Elevated prostate specific antigen [PSA]: Secondary | ICD-10-CM | POA: Diagnosis not present

## 2023-09-30 DIAGNOSIS — S70362A Insect bite (nonvenomous), left thigh, initial encounter: Secondary | ICD-10-CM | POA: Diagnosis not present

## 2023-09-30 DIAGNOSIS — Z9189 Other specified personal risk factors, not elsewhere classified: Secondary | ICD-10-CM | POA: Diagnosis not present

## 2023-12-07 DIAGNOSIS — H2513 Age-related nuclear cataract, bilateral: Secondary | ICD-10-CM | POA: Diagnosis not present

## 2023-12-15 DIAGNOSIS — Z79899 Other long term (current) drug therapy: Secondary | ICD-10-CM | POA: Diagnosis not present

## 2023-12-15 DIAGNOSIS — R972 Elevated prostate specific antigen [PSA]: Secondary | ICD-10-CM | POA: Diagnosis not present

## 2023-12-15 DIAGNOSIS — E611 Iron deficiency: Secondary | ICD-10-CM | POA: Diagnosis not present

## 2023-12-22 DIAGNOSIS — R972 Elevated prostate specific antigen [PSA]: Secondary | ICD-10-CM | POA: Diagnosis not present

## 2023-12-22 DIAGNOSIS — Z0000 Encounter for general adult medical examination without abnormal findings: Secondary | ICD-10-CM | POA: Diagnosis not present

## 2023-12-22 DIAGNOSIS — Z23 Encounter for immunization: Secondary | ICD-10-CM | POA: Diagnosis not present

## 2023-12-22 DIAGNOSIS — D6869 Other thrombophilia: Secondary | ICD-10-CM | POA: Diagnosis not present

## 2023-12-22 DIAGNOSIS — F330 Major depressive disorder, recurrent, mild: Secondary | ICD-10-CM | POA: Diagnosis not present

## 2023-12-22 DIAGNOSIS — D692 Other nonthrombocytopenic purpura: Secondary | ICD-10-CM | POA: Diagnosis not present

## 2023-12-22 DIAGNOSIS — Z1339 Encounter for screening examination for other mental health and behavioral disorders: Secondary | ICD-10-CM | POA: Diagnosis not present

## 2023-12-22 DIAGNOSIS — G2581 Restless legs syndrome: Secondary | ICD-10-CM | POA: Diagnosis not present

## 2023-12-22 DIAGNOSIS — Z1331 Encounter for screening for depression: Secondary | ICD-10-CM | POA: Diagnosis not present

## 2023-12-22 DIAGNOSIS — R0683 Snoring: Secondary | ICD-10-CM | POA: Diagnosis not present

## 2023-12-22 DIAGNOSIS — Z87891 Personal history of nicotine dependence: Secondary | ICD-10-CM | POA: Diagnosis not present

## 2024-01-07 DIAGNOSIS — H52221 Regular astigmatism, right eye: Secondary | ICD-10-CM | POA: Diagnosis not present

## 2024-01-07 DIAGNOSIS — H524 Presbyopia: Secondary | ICD-10-CM | POA: Diagnosis not present

## 2024-01-07 DIAGNOSIS — H2511 Age-related nuclear cataract, right eye: Secondary | ICD-10-CM | POA: Diagnosis not present

## 2024-01-08 DIAGNOSIS — H2512 Age-related nuclear cataract, left eye: Secondary | ICD-10-CM | POA: Diagnosis not present

## 2024-01-21 DIAGNOSIS — H524 Presbyopia: Secondary | ICD-10-CM | POA: Diagnosis not present

## 2024-01-21 DIAGNOSIS — H2512 Age-related nuclear cataract, left eye: Secondary | ICD-10-CM | POA: Diagnosis not present

## 2024-01-21 DIAGNOSIS — H52222 Regular astigmatism, left eye: Secondary | ICD-10-CM | POA: Diagnosis not present

## 2024-01-28 DIAGNOSIS — R972 Elevated prostate specific antigen [PSA]: Secondary | ICD-10-CM | POA: Diagnosis not present
